# Patient Record
Sex: Female | Born: 1935 | Race: White | Hispanic: No | Smoking: Current every day smoker
Health system: Southern US, Community
[De-identification: ages and names within clinical notes are randomized; demographics above are authoritative.]

## PROBLEM LIST (undated history)

## (undated) DIAGNOSIS — N183 Chronic kidney disease, stage 3 unspecified: Secondary | ICD-10-CM

## (undated) DIAGNOSIS — D472 Monoclonal gammopathy: Secondary | ICD-10-CM

## (undated) DIAGNOSIS — E785 Hyperlipidemia, unspecified: Secondary | ICD-10-CM

## (undated) DIAGNOSIS — H3530 Unspecified macular degeneration: Secondary | ICD-10-CM

## (undated) DIAGNOSIS — I6521 Occlusion and stenosis of right carotid artery: Secondary | ICD-10-CM

## (undated) DIAGNOSIS — Z8554 Personal history of malignant neoplasm of ureter: Secondary | ICD-10-CM

## (undated) DIAGNOSIS — Z86711 Personal history of pulmonary embolism: Secondary | ICD-10-CM

## (undated) DIAGNOSIS — K219 Gastro-esophageal reflux disease without esophagitis: Secondary | ICD-10-CM

## (undated) DIAGNOSIS — K5730 Diverticulosis of large intestine without perforation or abscess without bleeding: Secondary | ICD-10-CM

## (undated) DIAGNOSIS — D494 Neoplasm of unspecified behavior of bladder: Secondary | ICD-10-CM

## (undated) DIAGNOSIS — Z9109 Other allergy status, other than to drugs and biological substances: Secondary | ICD-10-CM

## (undated) DIAGNOSIS — I10 Essential (primary) hypertension: Secondary | ICD-10-CM

## (undated) DIAGNOSIS — I350 Nonrheumatic aortic (valve) stenosis: Secondary | ICD-10-CM

## (undated) DIAGNOSIS — J449 Chronic obstructive pulmonary disease, unspecified: Secondary | ICD-10-CM

## (undated) HISTORY — DX: Unspecified macular degeneration: H35.30

## (undated) HISTORY — DX: Essential (primary) hypertension: I10

## (undated) HISTORY — PX: CARDIOVASCULAR STRESS TEST: SHX262

## (undated) HISTORY — DX: Chronic obstructive pulmonary disease, unspecified: J44.9

## (undated) HISTORY — PX: OTHER SURGICAL HISTORY: SHX169

---
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
## 1977-01-24 HISTORY — PX: ABDOMINAL HYSTERECTOMY: SHX81

## 1997-05-14 ENCOUNTER — Other Ambulatory Visit: Admission: RE | Admit: 1997-05-14 | Discharge: 1997-05-14 | Payer: Self-pay | Admitting: Obstetrics and Gynecology

## 1998-06-24 ENCOUNTER — Other Ambulatory Visit: Admission: RE | Admit: 1998-06-24 | Discharge: 1998-06-24 | Payer: Self-pay | Admitting: Obstetrics and Gynecology

## 1999-05-10 ENCOUNTER — Encounter: Payer: Self-pay | Admitting: Family Medicine

## 1999-05-10 ENCOUNTER — Encounter: Admission: RE | Admit: 1999-05-10 | Discharge: 1999-05-10 | Payer: Self-pay | Admitting: Family Medicine

## 1999-06-04 ENCOUNTER — Other Ambulatory Visit: Admission: RE | Admit: 1999-06-04 | Discharge: 1999-06-04 | Payer: Self-pay | Admitting: Obstetrics and Gynecology

## 1999-10-29 ENCOUNTER — Encounter: Payer: Self-pay | Admitting: Emergency Medicine

## 1999-10-29 ENCOUNTER — Emergency Department (HOSPITAL_COMMUNITY): Admission: EM | Admit: 1999-10-29 | Discharge: 1999-10-29 | Payer: Self-pay | Admitting: Emergency Medicine

## 2000-02-07 ENCOUNTER — Encounter: Payer: Self-pay | Admitting: Obstetrics and Gynecology

## 2000-02-07 ENCOUNTER — Encounter: Admission: RE | Admit: 2000-02-07 | Discharge: 2000-02-07 | Payer: Self-pay | Admitting: Obstetrics and Gynecology

## 2000-05-19 ENCOUNTER — Encounter: Payer: Self-pay | Admitting: Obstetrics and Gynecology

## 2000-05-19 ENCOUNTER — Encounter: Admission: RE | Admit: 2000-05-19 | Discharge: 2000-05-19 | Payer: Self-pay | Admitting: Obstetrics and Gynecology

## 2000-06-09 ENCOUNTER — Other Ambulatory Visit: Admission: RE | Admit: 2000-06-09 | Discharge: 2000-06-09 | Payer: Self-pay | Admitting: Obstetrics and Gynecology

## 2001-06-06 ENCOUNTER — Encounter: Admission: RE | Admit: 2001-06-06 | Discharge: 2001-06-06 | Payer: Self-pay | Admitting: Obstetrics and Gynecology

## 2001-06-06 ENCOUNTER — Encounter: Payer: Self-pay | Admitting: Obstetrics and Gynecology

## 2002-07-01 ENCOUNTER — Encounter: Admission: RE | Admit: 2002-07-01 | Discharge: 2002-07-01 | Payer: Self-pay | Admitting: Obstetrics and Gynecology

## 2002-07-01 ENCOUNTER — Encounter: Payer: Self-pay | Admitting: Obstetrics and Gynecology

## 2002-07-06 ENCOUNTER — Encounter: Payer: Self-pay | Admitting: Emergency Medicine

## 2002-07-06 ENCOUNTER — Emergency Department (HOSPITAL_COMMUNITY): Admission: EM | Admit: 2002-07-06 | Discharge: 2002-07-06 | Payer: Self-pay | Admitting: Emergency Medicine

## 2003-03-03 ENCOUNTER — Emergency Department (HOSPITAL_COMMUNITY): Admission: EM | Admit: 2003-03-03 | Discharge: 2003-03-03 | Payer: Self-pay | Admitting: Emergency Medicine

## 2003-03-19 ENCOUNTER — Encounter (HOSPITAL_BASED_OUTPATIENT_CLINIC_OR_DEPARTMENT_OTHER): Admission: RE | Admit: 2003-03-19 | Discharge: 2003-03-31 | Payer: Self-pay | Admitting: Internal Medicine

## 2003-08-13 ENCOUNTER — Encounter: Admission: RE | Admit: 2003-08-13 | Discharge: 2003-08-13 | Payer: Self-pay | Admitting: Obstetrics and Gynecology

## 2004-11-21 ENCOUNTER — Observation Stay (HOSPITAL_COMMUNITY): Admission: EM | Admit: 2004-11-21 | Discharge: 2004-11-22 | Payer: Self-pay | Admitting: *Deleted

## 2005-05-13 ENCOUNTER — Encounter: Admission: RE | Admit: 2005-05-13 | Discharge: 2005-05-13 | Payer: Self-pay | Admitting: Obstetrics and Gynecology

## 2005-06-13 ENCOUNTER — Encounter: Admission: RE | Admit: 2005-06-13 | Discharge: 2005-06-28 | Payer: Self-pay | Admitting: Family Medicine

## 2005-09-22 ENCOUNTER — Ambulatory Visit: Payer: Self-pay | Admitting: Internal Medicine

## 2005-10-19 ENCOUNTER — Ambulatory Visit: Payer: Self-pay | Admitting: Internal Medicine

## 2005-10-27 ENCOUNTER — Ambulatory Visit: Payer: Self-pay | Admitting: Internal Medicine

## 2006-11-22 ENCOUNTER — Encounter: Admission: RE | Admit: 2006-11-22 | Discharge: 2006-11-22 | Payer: Self-pay | Admitting: Otolaryngology

## 2007-03-21 ENCOUNTER — Encounter: Admission: RE | Admit: 2007-03-21 | Discharge: 2007-03-21 | Payer: Self-pay | Admitting: Family Medicine

## 2009-09-08 ENCOUNTER — Encounter: Admission: RE | Admit: 2009-09-08 | Discharge: 2009-09-08 | Payer: Self-pay | Admitting: Geriatric Medicine

## 2010-05-27 ENCOUNTER — Other Ambulatory Visit: Payer: Self-pay | Admitting: Nephrology

## 2010-05-27 ENCOUNTER — Ambulatory Visit
Admission: RE | Admit: 2010-05-27 | Discharge: 2010-05-27 | Disposition: A | Discharge status: Home / Self Care | Payer: Medicare Other | Source: Ambulatory Visit | Attending: Nephrology | Admitting: Nephrology

## 2010-05-27 DIAGNOSIS — D472 Monoclonal gammopathy: Secondary | ICD-10-CM

## 2010-06-02 ENCOUNTER — Other Ambulatory Visit (HOSPITAL_COMMUNITY): Payer: Self-pay | Admitting: Urology

## 2010-06-02 ENCOUNTER — Other Ambulatory Visit: Payer: Self-pay | Admitting: Oncology

## 2010-06-02 ENCOUNTER — Encounter (HOSPITAL_BASED_OUTPATIENT_CLINIC_OR_DEPARTMENT_OTHER): Payer: Medicare Other | Admitting: Oncology

## 2010-06-02 DIAGNOSIS — N1330 Unspecified hydronephrosis: Secondary | ICD-10-CM

## 2010-06-02 DIAGNOSIS — D472 Monoclonal gammopathy: Secondary | ICD-10-CM

## 2010-06-02 DIAGNOSIS — N289 Disorder of kidney and ureter, unspecified: Secondary | ICD-10-CM

## 2010-06-02 LAB — CBC WITH DIFFERENTIAL/PLATELET
BASO%: 0.4 % (ref 0.0–2.0)
HCT: 33.7 % — ABNORMAL LOW (ref 34.8–46.6)
HGB: 11.3 g/dL — ABNORMAL LOW (ref 11.6–15.9)
MONO#: 0.5 10*3/uL (ref 0.1–0.9)
MONO%: 7.2 % (ref 0.0–14.0)
NEUT#: 5.2 10*3/uL (ref 1.5–6.5)
RDW: 13.7 % (ref 11.2–14.5)
WBC: 7.6 10*3/uL (ref 3.9–10.3)
lymph#: 1.7 10*3/uL (ref 0.9–3.3)

## 2010-06-04 ENCOUNTER — Other Ambulatory Visit (HOSPITAL_COMMUNITY): Payer: Medicare Other

## 2010-06-07 ENCOUNTER — Encounter (HOSPITAL_COMMUNITY)
Admission: RE | Admit: 2010-06-07 | Discharge: 2010-06-07 | Disposition: A | Discharge status: Home / Self Care | Payer: Medicare Other | Source: Ambulatory Visit | Attending: Urology | Admitting: Urology

## 2010-06-07 DIAGNOSIS — N1330 Unspecified hydronephrosis: Secondary | ICD-10-CM

## 2010-06-07 DIAGNOSIS — N289 Disorder of kidney and ureter, unspecified: Secondary | ICD-10-CM

## 2010-06-07 LAB — COMPREHENSIVE METABOLIC PANEL
ALT: 33 U/L (ref 0–35)
AST: 42 U/L — ABNORMAL HIGH (ref 0–37)
Alkaline Phosphatase: 55 U/L (ref 39–117)
BUN: 44 mg/dL — ABNORMAL HIGH (ref 6–23)
Calcium: 9.9 mg/dL (ref 8.4–10.5)
Creatinine, Ser: 1.73 mg/dL — ABNORMAL HIGH (ref 0.40–1.20)
Total Protein: 6.7 g/dL (ref 6.0–8.3)

## 2010-06-07 LAB — SPEP & IFE WITH QIG
Albumin ELP: 59.9 % (ref 55.8–66.1)
Gamma Globulin: 13.3 % (ref 11.1–18.8)
Total Protein, Serum Electrophoresis: 6.7 g/dL (ref 6.0–8.3)

## 2010-06-07 LAB — KAPPA/LAMBDA LIGHT CHAINS
Kappa free light chain: 1.49 mg/dL (ref 0.33–1.94)
Lambda Free Lght Chn: 2.13 mg/dL (ref 0.57–2.63)

## 2010-06-07 MED ORDER — TECHNETIUM TC 99M MERTIATIDE
15.2000 | Freq: Once | INTRAVENOUS | Status: AC | PRN
  Administered 2010-06-07: 15.2 via INTRAVENOUS

## 2010-06-07 MED ORDER — FUROSEMIDE 10 MG/ML IJ SOLN: 23.0000 mg/h | INTRAVENOUS | Status: DC

## 2010-06-11 NOTE — Consult Note (Signed)
NAMEJESSIECA, [name]                        ACCOUNT NO.:  [id number]   MEDICAL RECORD NO.:  [id number]                   PATIENT TYPE:  REC   LOCATION:  FOOT                                 FACILITY:  [facility]   PHYSICIAN:  Jonelle Sports. Sevier, M.D.              DATE OF BIRTH:  1924-10-17   DATE OF CONSULTATION:  03/20/2003  DATE OF DISCHARGE:                                   CONSULTATION   HISTORY:  This 77 year old white female is seen at the courtesy of Dr.  Cyndia Bent for evaluation of chronic traumatic lesion on the right  pretibial area.   The patient apparently has enjoyed good health and has had no significant  lower extremity problems despite the fact that she is a smoker of one-half  pack of cigarettes per day for greater than 50 years.   In mid January she sustained a traumatic skin avulsion on the right anterior  pretibial area.  This became secondarily reddened, and she was seen by her  primary doctor and then later by Dr. Jamey Ripa.  She was given a course of Z-  Pak initially and then later a 10-day course of Tequin.  Dr. Cyndia Bent debrided the lesion on at least 1 occasion and treated it then with  wet-to-dry dressings.  She was referred here for ongoing care.   The patient reports that since she attempted to schedule this appointment  approximately 3 or 4 weeks ago that there has been progressive improvement  in the area, which is now covered by a dark eschar that has gotten  progressively smaller.   She is here today for our evaluation and advice.   PAST MEDICAL HISTORY:  Positive for asthma and hypertension.   ALLERGIES:  She is allergic to PENICILLIN.   MEDICATIONS:  Regular medications include Advair and Singulair as well as  lisinopril.   PHYSICAL EXAMINATION:  Examination today is limited to the distal lower  extremities.  The feet are without edema or deformity.  There is evidence of  superficial varicosities but no evidence of  chronic  venous insufficiency.  Skin temperatures are normal and symmetrical.  There are minor calluses at  several areas on the feet and also a minor corn on the dorsal aspect of the  left fifth toe.  Pulses are everywhere palpable and quite adequate.  Monofilament testing shows she has preservation of protective sensation  throughout both feet.   DISPOSITION:  1. The wound is carefully examined and shows no evidence of secondary     infection or inflammation.  In that it appears to be gradually healing     beneath this eschar and in that they report it has gotten progressively     smaller, it is recommended to the patient that we not remove the eschar     but rather allow this healing to continue with the anticipation that it     may  take several months for complete healing.  She is advised in the     interim to keep it clean,     to apply Neosporin on a daily basis, and to cover it with a nonstick pad     for some added protection.  2. She is given an open-door policy here so should there be any worsening     she might feel free to come back without hesitation.                                               Jonelle Sports. Cheryll Cockayne, M.D.    RES/MEDQ  D:  03/20/2003  T:  03/20/2003  Job:  [id number]   cc:   Currie Paris, M.D.  [address]  [place]  [place] [address]  Fax: [phone]

## 2010-06-11 NOTE — Assessment & Plan Note (Signed)
[facility]                               PULMONARY OFFICE NOTE   NAME:Umbarger, NYILAH KIGHT                     MRN:          [id number]  DATE:09/22/2005                            DOB:          09/20/1924    DATE OF CONSULTATION:  September 22, 2005.   REASON FOR CONSULTATION:  Dyspnea.   HISTORY:  This is a 77 year old, white female with variable dyspnea dating  back several years; still actively smoking three to four cigarettes per day  but has noted marked increase in dyspnea over the last three weeks since her  daughter moved back in with her three weeks ago and brought a dog with her.  She denies any itching, sneezing but does have subjective wheeze, cough and  congestion that is worse when she is at home.  She denies any exertional  chest pain, purulent sputum, active sinus or reflux symptoms.  The patient  has been taking prednisone presently with no apparent benefit in terms of  her dyspnea and only marginal improvement after Albuterol HFA (she strongly  prefers Albuterol but is having getting it).   PAST MEDICAL HISTORY:  Past medical history is significant for:  1. Hypertension for which she is on ACE inhibitors.  2. Hyperlipidemia.   ALLERGIES:  Penicillin and sulfa medications.   MEDICATIONS:  1. Lisinopril;  2. Vytorin;  3. Toprol;  4. Singulair;  5. Aspirin;  6. Advair;  7. Calcium;  8. Multivitamins;  9. Prednisone;  10.Flexeril;  11.ProAir.   For full interval dosing, please see note of September 22, 2005.   SOCIAL HISTORY:  She continues to smoke three to four cigarettes per day.  She is retired.  No unusual travel or hobby exposure.  She does have a new  pet in the house as noted over the three weeks that she has been worse,  although note she really complains of dyspnea dating back several years.   FAMILY HISTORY:  Significant for the absence of respiratory distress or  atypia.   REVIEW OF SYSTEMS:  Taken in detail,  significant for the problems outlined  above.   PHYSICAL EXAMINATION:  GENERAL:  This is a hoarse, white female in no acute  distress.  She has classic voice fatigue and the hoarseness worsened the  more she talked.  VITAL SIGNS:  She is afebrile.  Blood pressure is 140/94.  HEENT:  Unremarkable.  Pharynx clear.  No evidence of excessive postnasal  drainage or cobblestoning.  NECK:  Neck is supple without cervical adenopathy or tenderness.  The  trachea is in the midline.  LUNGS:  The lung fields reveal more pseudo-wheeze than true wheeze  bilaterally.  HEART:  Regular rate and rhythm without murmurs, gallops or rubs.  ABDOMEN:  Soft, benign.  EXTREMITIES:  Warm without calf tenderness, clubbing, cyanosis or edema.   LABORATORY DATA:  Chest x-ray was not available done at [facility] in November of  2006.   IMPRESSION:  Refractory asthma with mostly upper airway features at  present.  May either be an adverse effect of Advair, occult reflux, or  adverse effect from Lisinopril.   RECOMMENDATIONS:  I therefore recommend the following:  Stop Advair and  Lisinopril and replace these with Symbicort 160/4.5, two puffs twice daily  with p.r.n. use of Albuterol (fine with me if she uses [facility] product but the  point is still to minimize the need for this).  Replace Lisinopril with  Benicar 40/12.5 mg on an empiric, short-trial basis only with follow up here  with pulmonary function tests and a chest x-ray within the next six weeks  (she has enough samples to last six weeks at present).                                   Charlaine Dalton. Sherene Sires, MD, [facility]   MBW/MedQ  DD:  09/22/2005  DT:  09/23/2005  Job #:  [id number]   cc:   Caryn Bee L. Little, MD

## 2010-06-11 NOTE — H&P (Signed)
[name], [name] NO.:  [id number]   MEDICAL RECORD NO.:  [id number]          PATIENT TYPE:  EMS   LOCATION:  MAJO                         FACILITY:  [facility]   PHYSICIAN:  Hollice Espy, M.D.DATE OF BIRTH:  Jan 28, 1924   DATE OF ADMISSION:  11/21/2004  DATE OF DISCHARGE:                                HISTORY & PHYSICAL   PRIMARY CARE PHYSICIAN:  Chales Salmon. Abigail Miyamoto, M.D.   COVERING PHYSICIAN FOR DR Abigail Miyamoto:  Dr. Tenny Craw.   CHIEF COMPLAINT:  Swollen tongue.   The patient is a 77 year old white female with a past medical history of  hypertension, hyperlipidemia, who is scheduled to follow to have a  colonoscopy. She gives a very slightly vague history, but apparently an  infection was noted and Dr. Tenny Craw, who is covering for Dr. Abigail Miyamoto, called in  the patient Bactrim, an antibiotic, one tablet p.o. b.i.d., and plans for a  colonoscopy. The patient, on Saturday, took one dose in the morning and one  dose in the evening. She woke up in the middle of the night feeling slightly  short of breath. She went into the bathroom and noted that her tongue was  quite enlarged. She had trouble talking and she became concerned. Paramedics  were called and she was brought into the emergency room. In the emergency  room she was given one dose of Solu-Medrol 125 mg, one dose of IV Benadryl  at 25 mg as well as Pepcid and an albuterol inhaler. The patient began to  feel markedly better and currently feels quite sleepy. Three hours later the  patient continues to feel better. However, her tongue is still swollen and  she still has difficulty with her speech. She, otherwise, appears to be in  no apparent distress. She denies any other complaints such as headaches,  chest pain, dysphagia, palpitations, wheezing, coughing, shortness of  breath, abdominal pain, hematuria, dysuria, constipation, diarrhea, focal  extremity numbness, weakness, or pain.   REVIEW OF SYSTEMS:  Otherwise  negative.   After discussion with the ER attending, plan will be to put the patient in a  12 to 24 hour observation and once her symptoms have completely resolved she  may be able to go home if there are no further incidents. The patient is  amenable to this.   PAST MEDICAL HISTORY:  1.  Hyperlipidemia.  2.  Tobacco abuse.  3.  Hypertension.  4.  Asthma.   MEDICATIONS:  1.  Lisinopril 40 mg daily.  2.  Vytorin 10/20 p.o. daily.  3.  Toprol XL 25 mg daily.  4.  Singulair 10 p.o. daily.  5.  Aspirin 81 mg output daily.  6.  Os-Cal D 1200 p.o. daily.  7.  MVI p.o. daily.  8.  Advair Diskus 250/50 one puff b.i.d.  9.  Albuterol inhaler p.r.n.   ALLERGIES:  She has a previous allergy to PENICILLIN and we will add SULFA  to this allergy.   SOCIAL HISTORY:  She denies any drug or alcohol use or abuse. She smokes  about a half pack a day.   FAMILY  HISTORY:  Noncontributory.   PHYSICAL EXAMINATION:  VITAL SIGNS: On admission, temperature 95.6, heart  rate 81, blood pressure 134/57, O2 saturation 98% on 10 liters. Now she is  saturating 96% on room air. Her blood pressure has come down to 109/55.  GENERAL: The patient is alert and oriented times three and in no apparent  distress.  HEENT: Normocephalic and atraumatic. Her mucous membranes are slightly dry.  She is noted to have an enlarged tongue. Her speech is affected by this. No  carotid bruits.  HEART: Regular rate and rhythm. S1 and S2.  LUNGS: Clear to auscultation bilaterally.  ABDOMEN: Soft, nontender, nondistended. Positive bowel sounds.  EXTREMITIES: No clubbing, cyanosis, or edema.   LABWORK:  White count 8.9, H&H 12.8/37.8, MCV 91, platelet count 279,000. UA  shows trace leukocyte esterase, small hemoglobin. Sodium 137, potassium 4.3,  chloride 108, bicarbonate 27, BUN 27, creatinine 2. UA shows 7-10 red cells,  white cells 0-2, glucose 103.   ASSESSMENT/PLAN:  1.  Renal insufficiency. Will put the patient on  normal saline at 100 mL an      hour. Likely some complaint of this is dehydration and this should      improve with IV fluids. She also knew she may have some underlying renal      insufficiency secondary to hypertension.  2.  Medication allergy. Will observe the patient holding her medication and      have p.r.n. Benadryl and albuterol if necessary. The patient is feeling      better, tolerating p.o., and her symptoms have resolved. Will be able to      discharge her later on today and if not tomorrow morning, and she can      follow up with her PCP.      Hollice Espy, M.D.  Electronically Signed     SKK/MEDQ  D:  11/21/2004  T:  11/21/2004  Job:  [id number]   cc:   Chales Salmon. Abigail Miyamoto, M.D.  Fax: [phone]   Dr. Tenny Craw

## 2010-06-11 NOTE — Assessment & Plan Note (Signed)
[facility]                               PULMONARY OFFICE NOTE   NAME:Wisener, SHANTAL ROAN                     MRN:          [id number]  DATE:10/27/2005                            DOB:          [birth year]    HISTORY:  This is a 77 year old white female still smoking a few  cigarettes complaining of dyspnea only with heavy exertion, but no  orthopnea, PND, leg swelling, fever, chills, or sweats.  She comes in as  requested for PFTs, having been taken off Lisinopril in favor of Benicar 1/2  daily for a possible upper airway cough syndrome/pseudo-wheeze, which has  resolved completely.   PHYSICAL EXAMINATION:  She is a pleasant ambulatory white female with  minimal hoarseness.  She is afebrile with stable vital signs.  HEENT:  Unremarkable.  Oropharynx is clear.  LUNGS:  Fields reveal diminished breath sounds with no wheezing.  HEART:  Regular rate and rhythm without murmur, gallop or rub.  ABDOMEN:  Soft, benign.  EXTREMITIES:  Warm without calf tenderness, cyanosis, clubbing, or edema.   Hemoglobin saturation 98% on room air.  PFTs reveal an FEV1 of 85% predicted  with a ratio of 45%, a 23% improvement after bronchodilators.   IMPRESSION:  1. Chronic obstructive pulmonary disease with a definite asthmatic      component which is only partially reversed by Symbicort likely because      the patient is actively smoking.  I have recommended that she continue      Symbicort.  2. Cough has completely been eliminated off of ACE inhibitors while being      treated for acid reflux.  I was hoping the cough was due to ACE      inhibitors, but the last notes indicate she did not improve until      Zegerid was added.  I have asked her to continue these but keep in mind      that smoking may be contributing to both lower and upper airway      instability and if she gets rid of the cigarettes we may be able to      get rid of some of her maintenance  medicines.   In the absence of a complete commitment on her part to stop smoking, I am  going to recommend that she stay on the Zegerid 40 mg at bedtime and  continue, also, Symbicort 160/4.5, 2 puffs b.i.d.  If she is not doing as  well as she wants on this regimen, I would be happy to see her back but will  continue to point out to her that smoking sensation, not choice of inhalers  or physicians, is really the key to long term lung health.  If she does  great on this combination and is able to stop smoking, I would consider  starting the Zegerid first and tapering off the Symbicort second.   I reviewed these issues with her in detail and also reviewed the  significance of her PFTs in the context of long term lung health.  We will  see her back here  on a p.r.n. basis only.  P.r.n. refills or she can see Dr.  Catha Gosselin for refills.            ______________________________  Charlaine Dalton Sherene Sires, MD, [facility]      MBW/MedQ  DD:  10/27/2005  DT:  10/28/2005  Job #:  [id number]   cc:   Caryn Bee L. Little, M.D.

## 2010-06-11 NOTE — Assessment & Plan Note (Signed)
[facility]                               PULMONARY OFFICE NOTE   NAME:[name], [name]                     MRN:          [id number]  DATE:10/19/2005                            DOB:          [birth year]    HISTORY:  A 77 year old white female who just stopped smoking several days  ago with complaints of sore throat for the last 10 days and feeling that her  throat is closing up.  I was concerned about pseudoasthma when I saw her  on August 30 and asked her to stop lisinopril, which she assures me she has  done and replaced it with Benicar/HCTZ 40/12.5 1 daily.  She denies any  overt reflux or sinus symptoms, purulent sputum or difficulty swallowing.   PHYSICAL EXAMINATION:  GENERAL:  She is a robust, pleasant elderly white  female who is mildly hoarse but otherwise in no acute distress.  VITAL SIGNS:  She has stable vital signs.  HEENT:  Remarkable for the absence of erythema or cervical tenderness.  LUNGS:  Lung fields reveal diminished breath sounds but no wheezing.  HEART:  Regular rate and rhythm without murmur, rub or gallop.  ABDOMEN:  Soft, benign.  EXTREMITIES:  Normal without calf tenderness, clubbing, cyanosis or edema.   IMPRESSION:  1. No evidence of pharyngitis.  Most likely what she is experiencing is      reflux for which lisinopril was simply fueling the fire, rather than      the cause of the fire.  I am going to add Zegerid, therefore, 40 mg at      bedtime until she returns for PFTs, as previously requested.  2. Singulair probably is not needed.  At this point, I am going to ask her      to stop it and see if any of her upper airway symptoms flare while      being treated for reflux, reasoning that there probably are not two      primary processes going on at the same time, rhinitis and reflux, and      that the rhinitis she may have experienced very well may have been      coming from reflux from the start.  3. Finally, note  that her blood pressure is relatively low on Benicar      40/HCT, which replaced the lisinopril; therefore, I am going to reduce      the dose to one-half daily.  4. If she continues to have upper airway symptoms, I believe an ears,      nose, and throat evaluation would be appropriate.  Will defer this to      Dr. Fredirick Maudlin judgment.            ______________________________  Charlaine Dalton. Sherene Sires, MD, [facility]    MBW/MedQ  DD:  10/19/2005  DT:  10/21/2005  Job #:  [id number]   cc:   Caryn Bee L. Little, M.D.

## 2010-06-15 ENCOUNTER — Other Ambulatory Visit: Payer: Self-pay | Admitting: Urology

## 2010-06-15 ENCOUNTER — Ambulatory Visit (HOSPITAL_COMMUNITY)
Admission: RE | Admit: 2010-06-15 | Discharge: 2010-06-15 | Disposition: A | Discharge status: Home / Self Care | Payer: Medicare Other | Source: Ambulatory Visit | Attending: Urology | Admitting: Urology

## 2010-06-15 ENCOUNTER — Ambulatory Visit (HOSPITAL_COMMUNITY): Payer: Medicare Other

## 2010-06-15 ENCOUNTER — Ambulatory Visit (HOSPITAL_BASED_OUTPATIENT_CLINIC_OR_DEPARTMENT_OTHER)
Admission: RE | Admit: 2010-06-15 | Discharge: 2010-06-15 | Disposition: A | Discharge status: Home / Self Care | Payer: Medicare Other | Source: Ambulatory Visit | Attending: Urology | Admitting: Urology

## 2010-06-15 DIAGNOSIS — Z01818 Encounter for other preprocedural examination: Secondary | ICD-10-CM | POA: Insufficient documentation

## 2010-06-15 DIAGNOSIS — I10 Essential (primary) hypertension: Secondary | ICD-10-CM | POA: Insufficient documentation

## 2010-06-15 DIAGNOSIS — N1330 Unspecified hydronephrosis: Secondary | ICD-10-CM | POA: Insufficient documentation

## 2010-06-15 DIAGNOSIS — Z01812 Encounter for preprocedural laboratory examination: Secondary | ICD-10-CM | POA: Insufficient documentation

## 2010-06-15 DIAGNOSIS — Z79899 Other long term (current) drug therapy: Secondary | ICD-10-CM | POA: Insufficient documentation

## 2010-06-15 DIAGNOSIS — D4959 Neoplasm of unspecified behavior of other genitourinary organ: Secondary | ICD-10-CM | POA: Insufficient documentation

## 2010-06-15 DIAGNOSIS — R9431 Abnormal electrocardiogram [ECG] [EKG]: Secondary | ICD-10-CM | POA: Insufficient documentation

## 2010-06-15 HISTORY — PX: OTHER SURGICAL HISTORY: SHX169

## 2010-06-15 LAB — POCT I-STAT 4, (NA,K, GLUC, HGB,HCT)
HCT: 39 % (ref 36.0–46.0)
Hemoglobin: 13.3 g/dL (ref 12.0–15.0)
Potassium: 4.6 mEq/L (ref 3.5–5.1)
Sodium: 143 mEq/L (ref 135–145)

## 2010-07-06 ENCOUNTER — Other Ambulatory Visit (HOSPITAL_COMMUNITY): Payer: Medicare Other

## 2010-07-09 ENCOUNTER — Inpatient Hospital Stay (HOSPITAL_COMMUNITY): Admission: RE | Admit: 2010-07-09 | Payer: Medicare Other | Source: Ambulatory Visit | Admitting: Urology

## 2010-07-17 NOTE — Op Note (Signed)
[name] NO.:  [id number]  MEDICAL RECORD NO.:  [id number]           PATIENT TYPE:  O  LOCATION:  XRAY                         FACILITY:  [facility]  PHYSICIAN:  Danae Chen, M.D.  DATE OF BIRTH:  [birth year]  DATE OF PROCEDURE:  06/15/2010 DATE OF DISCHARGE:                              OPERATIVE REPORT   PREOPERATIVE DIAGNOSIS:  Severe left hydroureteronephrosis.  POSTOPERATIVE DIAGNOSIS:  Severe left hydroureteronephrosis, plus left distal ureteral tumor.  PROCEDURE:  Cystoscopy, left retrograde pyelogram, ureteroscopy, biopsy of left ureteral tumor and insertion of double-J stent.  SURGEON:  Danae Chen, M.D.  ANESTHESIA:  General.  INDICATIONS:  The patient is an 77 year old female who was found on ultrasound to have severe left hydronephrosis.  CT scan revealed severe left hydroureteronephrosis down to the left UVJ.  Renal scan showed 56% function on the right side and 44% function on the left.  She does not have any history of hematuria.  She is scheduled today for cystoscopy and left retrograde pyelogram and insertion of double-J stent.  DESCRIPTION OF PROCEDURE:  The patient was identified by her wristband and proper time-out was taken.  Under general anesthesia, she was prepped and draped and placed in the dorsal lithotomy position.  A panendoscope was inserted in the bladder. The bladder mucosa is normal.  There is no stone or tumor in the bladder.  The ureteral orifices are in normal position and shape.  Retrograde pyelogram.  A cone-tip catheter was passed through the cystoscope and through the left ureteral orifice.  Contrast was then injected through the cone-tip catheter.  There is a filling defect in the distal ureter at and above the UVJ.  The ureter proximal to the filling defect is markedly dilated.  The cone-tip catheter was removed.  A sensor wire could not be passed through the ureter.  The sensor wire was removed.  A  Glidewire was then passed through an open-ended catheter and both open-ended catheter and Glidewire were passed through the cystoscope and through the ureteral orifice and the Glidewire was advanced without difficulty into the proximal ureter.  The open-ended catheter was then advanced over the guidewire.  There was difficulty getting the open-ended catheter into the renal pelvis.  The Glidewire was removed and contrast was then injected through the open-ended catheter.  The proximal ureter is tortuous and making it difficult to advance the open-ended catheter into the renal pelvis.  The renal pelvis is markedly dilated.  The Glidewire was then again passed through the open-ended catheter and after several attempts I was able to pass the Glidewire into the renal pelvis and the open-ended catheter was then advanced over the Glidewire into the renal pelvis.  The Glidewire was removed.  Contrast was then injected through the open-ended catheter and it showed marked dilation of the renal pelvis and collecting system and calyces.  The sensor wire was then passed over the open-ended catheter and the open-ended catheter was removed.  The bladder was emptied and the cystoscope removed.  A semirigid ureteroscope was then passed in the bladder and through the left ureteral orifice.  There  was a tumor in the distal ureter slightly above the UVJ.  The ureter proximal to the ureteral tumor is markedly dilated.  A brush biopsy forceps was then passed through the ureteroscope and a brush biopsy of the ureteral tumor was done.  The specimen was then placed in saline and sent to pathology.  Then cold cup biopsy forceps was passed through the ureteroscope and several biopsies of the ureteral tumor were done.  The specimen was also sent to pathology.  The ureter proximal to the distal ureteral tumor is markedly dilated without any evidence of filling defect in the ureter.  The ureteroscope was removed.  The  guidewire was then back loaded into the cystoscope and a #8-French - 28 double-J stent was passed over the guidewire.  The proximal curl of the double-J stent is in the renal pelvis.  The guidewire was removed and the distal curl of the double-J stent is in the bladder.  The bladder was then emptied and the cystoscope and guidewire were removed.  The patient tolerated the procedure well and left the OR in satisfactory condition to postanesthesia care unit.     Danae Chen, M.D.     MN/MEDQ  D:  06/15/2010  T:  06/15/2010  Job:  [id number]  cc:   Caryn Bee L. Little, M.D. Fax: [phone]  Electronically Signed by Lindaann Slough M.D. on 07/17/2010 01:18:51 PM

## 2010-08-05 ENCOUNTER — Other Ambulatory Visit: Payer: Self-pay | Admitting: Urology

## 2010-08-05 ENCOUNTER — Encounter (HOSPITAL_COMMUNITY): Payer: Medicare Other

## 2010-08-05 LAB — CBC
HCT: 38.2 % (ref 36.0–46.0)
Hemoglobin: 12.5 g/dL (ref 12.0–15.0)
MCH: 30.9 pg (ref 26.0–34.0)
MCHC: 32.7 g/dL (ref 30.0–36.0)
MCV: 94.6 fL (ref 78.0–100.0)
WBC: 8.5 10*3/uL (ref 4.0–10.5)

## 2010-08-05 LAB — URINALYSIS, ROUTINE W REFLEX MICROSCOPIC
Ketones, ur: NEGATIVE mg/dL
Nitrite: NEGATIVE
Specific Gravity, Urine: 1.013 (ref 1.005–1.030)

## 2010-08-05 LAB — SURGICAL PCR SCREEN: Staphylococcus aureus: POSITIVE — AB

## 2010-08-05 LAB — COMPREHENSIVE METABOLIC PANEL
ALT: 52 U/L — ABNORMAL HIGH (ref 0–35)
Albumin: 3 g/dL — ABNORMAL LOW (ref 3.5–5.2)
BUN: 20 mg/dL (ref 6–23)
Chloride: 99 mEq/L (ref 96–112)
Creatinine, Ser: 1.27 mg/dL — ABNORMAL HIGH (ref 0.50–1.10)
GFR calc non Af Amer: 40 mL/min — ABNORMAL LOW (ref >60)
Potassium: 4.8 mEq/L (ref 3.5–5.1)
Sodium: 138 mEq/L (ref 135–145)

## 2010-08-05 LAB — URINE MICROSCOPIC-ADD ON

## 2010-08-06 LAB — URINE CULTURE
Colony Count: NO GROWTH
Culture  Setup Time: 201207130139
Special Requests: NEGATIVE

## 2010-08-13 ENCOUNTER — Inpatient Hospital Stay (HOSPITAL_COMMUNITY)
Admission: RE | Admit: 2010-08-13 | Discharge: 2010-08-18 | DRG: 658 | Disposition: A | Discharge status: Home Health | Payer: Medicare Other | Source: Ambulatory Visit | Attending: Urology | Admitting: Urology

## 2010-08-13 ENCOUNTER — Other Ambulatory Visit: Payer: Self-pay | Admitting: Urology

## 2010-08-13 DIAGNOSIS — J449 Chronic obstructive pulmonary disease, unspecified: Secondary | ICD-10-CM | POA: Diagnosis present

## 2010-08-13 DIAGNOSIS — N3289 Other specified disorders of bladder: Secondary | ICD-10-CM | POA: Diagnosis not present

## 2010-08-13 DIAGNOSIS — C669 Malignant neoplasm of unspecified ureter: Principal | ICD-10-CM | POA: Diagnosis present

## 2010-08-13 DIAGNOSIS — R310 Gross hematuria: Secondary | ICD-10-CM | POA: Diagnosis not present

## 2010-08-13 DIAGNOSIS — Z86711 Personal history of pulmonary embolism: Secondary | ICD-10-CM

## 2010-08-13 DIAGNOSIS — Z01812 Encounter for preprocedural laboratory examination: Secondary | ICD-10-CM

## 2010-08-13 DIAGNOSIS — J4489 Other specified chronic obstructive pulmonary disease: Secondary | ICD-10-CM | POA: Diagnosis present

## 2010-08-13 DIAGNOSIS — I10 Essential (primary) hypertension: Secondary | ICD-10-CM | POA: Diagnosis present

## 2010-08-13 DIAGNOSIS — E7800 Pure hypercholesterolemia, unspecified: Secondary | ICD-10-CM | POA: Diagnosis present

## 2010-08-13 HISTORY — PX: OTHER SURGICAL HISTORY: SHX169

## 2010-08-13 LAB — CBC
MCH: 30.1 pg (ref 26.0–34.0)
MCHC: 32 g/dL (ref 30.0–36.0)
RDW: 12.9 % (ref 11.5–15.5)

## 2010-08-13 LAB — BASIC METABOLIC PANEL
Calcium: 8.8 mg/dL (ref 8.4–10.5)
GFR calc Af Amer: 59 mL/min — ABNORMAL LOW (ref >60)
GFR calc non Af Amer: 49 mL/min — ABNORMAL LOW (ref >60)
Potassium: 4.4 mEq/L (ref 3.5–5.1)
Sodium: 135 mEq/L (ref 135–145)

## 2010-08-13 LAB — ABO/RH: ABO/RH(D): A POS

## 2010-08-14 LAB — CBC
Hemoglobin: 9.6 g/dL — ABNORMAL LOW (ref 12.0–15.0)
MCV: 93.6 fL (ref 78.0–100.0)
Platelets: 246 10*3/uL (ref 150–400)
RBC: 3.14 MIL/uL — ABNORMAL LOW (ref 3.87–5.11)
WBC: 10.2 10*3/uL (ref 4.0–10.5)

## 2010-08-14 LAB — BASIC METABOLIC PANEL
CO2: 27 mEq/L (ref 19–32)
Chloride: 101 mEq/L (ref 96–112)
Creatinine, Ser: 1.23 mg/dL — ABNORMAL HIGH (ref 0.50–1.10)
Glucose, Bld: 170 mg/dL — ABNORMAL HIGH (ref 70–99)
Sodium: 134 mEq/L — ABNORMAL LOW (ref 135–145)

## 2010-08-16 LAB — BASIC METABOLIC PANEL
BUN: 18 mg/dL (ref 6–23)
CO2: 31 mEq/L (ref 19–32)
Chloride: 102 mEq/L (ref 96–112)
Creatinine, Ser: 1.19 mg/dL — ABNORMAL HIGH (ref 0.50–1.10)
Glucose, Bld: 114 mg/dL — ABNORMAL HIGH (ref 70–99)

## 2010-08-16 LAB — CBC
HCT: 30 % — ABNORMAL LOW (ref 36.0–46.0)
MCH: 30 pg (ref 26.0–34.0)
MCV: 95.8 fL (ref 78.0–100.0)
RBC: 3.13 MIL/uL — ABNORMAL LOW (ref 3.87–5.11)
WBC: 9.6 10*3/uL (ref 4.0–10.5)

## 2010-08-19 ENCOUNTER — Emergency Department (HOSPITAL_COMMUNITY): Payer: Medicare Other

## 2010-08-19 ENCOUNTER — Emergency Department (HOSPITAL_COMMUNITY)
Admission: EM | Admit: 2010-08-19 | Discharge: 2010-08-19 | Disposition: A | Discharge status: Home / Self Care | Payer: Medicare Other | Attending: Emergency Medicine | Admitting: Emergency Medicine

## 2010-08-19 DIAGNOSIS — R0602 Shortness of breath: Secondary | ICD-10-CM | POA: Insufficient documentation

## 2010-08-19 DIAGNOSIS — Z8559 Personal history of malignant neoplasm of other urinary tract organ: Secondary | ICD-10-CM | POA: Insufficient documentation

## 2010-08-19 DIAGNOSIS — K209 Esophagitis, unspecified without bleeding: Secondary | ICD-10-CM | POA: Insufficient documentation

## 2010-08-19 DIAGNOSIS — I10 Essential (primary) hypertension: Secondary | ICD-10-CM | POA: Insufficient documentation

## 2010-08-19 DIAGNOSIS — R1310 Dysphagia, unspecified: Secondary | ICD-10-CM | POA: Insufficient documentation

## 2010-08-19 LAB — POCT I-STAT, CHEM 8
BUN: 26 mg/dL — ABNORMAL HIGH (ref 6–23)
Calcium, Ion: 1.09 mmol/L — ABNORMAL LOW (ref 1.12–1.32)
Creatinine, Ser: 1.3 mg/dL — ABNORMAL HIGH (ref 0.50–1.10)
Glucose, Bld: 111 mg/dL — ABNORMAL HIGH (ref 70–99)
TCO2: 27 mmol/L (ref 0–100)

## 2010-08-26 NOTE — H&P (Signed)
  NAMESAMARIAH, HOKENSON NO.:  [id number]  MEDICAL RECORD NO.:  [id number]  LOCATION:  0010                         FACILITY:  [facility]  PHYSICIAN:  Danae Chen, M.D.  DATE OF BIRTH:  1924-05-05  DATE OF ADMISSION:  08/13/2010 DATE OF DISCHARGE:                             HISTORY & PHYSICAL   CHIEF COMPLAINT:  Left distal ureteral tumor.  HISTORY OF PRESENT ILLNESS:  The patient is an 77 year old female who was found during workup for elevated creatinine to have severe left hydroureteronephrosis down to the left UVJ.  Creatinine went up to 1.75 on March 31, 2010 from 1.2 in April 2011.  She had low back pain radiating to the left lower quadrant and left leg.  She also had sensation of incomplete emptying of the bladder.  Cystoscopy and retrograde pyelogram showed a tumor in the left distal ureter with left proximal hydronephrosis.  Biopsy of the ureteral tumor showed a low- grade TCC.  The patient was initially scheduled for left distal ureterectomy on July 09, 2010.  However, she had pneumonia preoperatively and the surgery was canceled.  She was treated by Dr. Catha Gosselin.  She is now clear for the surgery.  She is now admitted for the procedure.  PAST MEDICAL HISTORY:  Positive for anxiety, asthma, hypercholesterolemia, hypertension.  She has a past history of pulmonary embolism.  PAST SURGICAL HISTORY:  Appendectomy, C-section, hysterectomy.  MEDICATIONS: 1. Alprazolam 0.5 mg. 2. Caltrate 600 plus D. 3. Fish oil. 4. Hydrocodone. 5. Symbicort 160/4.5 mg. 6. Tramadol 50 mg.  ALLERGIES:  She is allergic to PENICILLIN and SULFA.  FAMILY HISTORY:  Her father drowned at age 65.  Her mother had depression and died at age of 89.  She has one daughter.  SOCIAL HISTORY:  She had smoked half pack a day for 50 years and quit in April 2012.  She is a widow.  She does not drink.  REVIEW OF SYSTEMS:  As noted in the HPI and everything else is  negative.  PHYSICAL EXAMINATION:  GENERAL:  This is a well-developed 77 year old female who is in no acute distress.  She is alert and oriented to time, place and person. VITAL SIGNS:  Blood pressure is 160/59, pulse 66, respirations 16, temperature 97.6. HEENT:  Her head is normal.  She has pink conjunctivae.  Ears, nose and throat are within normal limits. NECK:  Supple.  She has no cervical adenopathy.  No thyromegaly. CHEST:  Symmetrical. LUNGS:  Fully expanded and clear to percussion and auscultation. HEART:  Regular rhythm. ABDOMEN:  Soft, nondistended.  There are well-healed surgical scars. She has no CVA tenderness.  Liver, spleen and kidneys are not palpable. Bladder is not distended.  Bowel sounds are normal. GENITALIA:  Meatus is normal.  She has normal female genitalia.  She is status post hysterectomy. EXTREMITIES:  Within normal limits.  IMPRESSION: 1. Left distal ureteral tumor. 2. Hypertension. 3. Asthma. 4. Hypercholesterolemia.     Danae Chen, M.D.     MN/MEDQ  D:  08/13/2010  T:  08/13/2010  Job:  [id number]  Electronically Signed by Lindaann Slough M.D. on 08/26/2010 09:23:03 PM

## 2010-08-26 NOTE — Op Note (Signed)
NAMEJANIRA, [name] NO.:  [id number]  MEDICAL RECORD NO.:  [id number]  LOCATION:  1434                         FACILITY:  [facility]  PHYSICIAN:  Danae Chen, M.D.  DATE OF BIRTH:  05-03-1924  DATE OF PROCEDURE:  08/13/2010 DATE OF DISCHARGE:                              OPERATIVE REPORT   PREOPERATIVE DIAGNOSIS:  Left distal ureteral tumor.  POSTOPERATIVE DIAGNOSIS:  Left distal ureteral tumor.  PROCEDURE DONE:  Left distal ureterectomy and left ureteral reimplantation.  SURGEON:  Danae Chen, MD  ASSISTANT:  Delia Chimes, NP/C.  ANESTHESIA:  General.  INDICATION:  The patient is an 77 year old female who was found during workup for elevated creatinine to have a severe left hydronephrosis. 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  A double-J stent was left in place.  She is now scheduled for left distal ureterectomy with ureteral reimplantation.  The patient was identified by her wristband and proper time-out was taken.  Under general anesthesia, she was prepped and draped, and placed in the supine position.  A #20-French Foley catheter was inserted in the bladder.  A longitudinal incision was made in the suprapubic area from the symphysis pubis to about 2 cm below the umbilicus.  The incision was carried down to the subcutaneous tissues.  The rectus fascia was incised and then the recti muscles were split in the midline.  The bladder was then filled with normal saline.  Two stay sutures of 2-0 chromic were then placed on the anterior wall of the bladder and a cystotomy was done.  The cystotomy was extended down to about 4 cm from the bladder neck.  A Bookwalter retractor was used to provide exposure to the bladder.  The ureteral orifices were identified.  There was a double-J stent coming out of the left ureteral orifice.  The left  ureteral orifice was then secured with #2-0 chromic, then a circumferential incision was made around the left ureteral orifice with a 1 cm margin. Then, the ureteral orifice was sharply dissected with the Metzenbaum scissors from the bladder.  Wide excision of the ureter was done.  It was difficult to find the left distal ureter, however, we found the stent in place.  The ureter was identified and secured with a Babcock clamp.  Then, the distal ureter was dissected sharply and bluntly down to the level of the bladder and the distal ureter was then completely freed from the bladder.  At this point, a Hem-o-lok clip was placed on the ureteral orifice and the ureter was pulled through the incision. Another Hem-o-lok clip was then placed on the distal ureter about 2 cm above the ureteral tumor and the ureter was transected.  The proximal end of the double-J stent was then removed.  The specimen was sent to Pathology for frozen section to find out if the proximal end of the ureter was free of tumor.  While awaiting the results of the frozen section, I closed down the bladder hiatus in 3 layers with #2-0 Vicryl.  A flexible ureteroscope was passed thru the ureter.  There was no evidence  of tumor in the proximal ureter.  Then the frozen section report came back with no evidence of tumor at the margins.  Then, a cystotomy was done and the ureter was brought through the cystotomy for ureteral reimplantation.  There was no tension on the ureter.  The ureter was then spatulated with Potts scissors and the ureter was approximated to the bladder mucosa with #4-0 Vicryl.  The ureter was also secured to the serosa of the bladder with #2-0 chromic.  Then, a double-J stent was passed through the ureter with the proximal end in the kidney and the distal end in the bladder.  The wound was then irrigated with normal saline.  The right ureteral orifice was clearly visualized with good efflux from the right  ureteral orifice.  Then, the cystotomy was closed in 2 layers with #2-0 Vicryl.  A Blake drain was placed through a stab wound and placed in the Retzius space.  A small rent in the peritoneum was closed with #2-0 Vicryl.  The wound was then irrigated with normal saline.  Exparel was then injected in the muscle of the surgical site for postsurgical anesthesia.  Then, the fascia was closed with #1 PDS and the skin was closed with skin staples.   A #20-French Foley catheter that was previously inserted in the bladder was left to straight drainage.  Estimated blood loss 175 cc.  Needle, sponge, and instrument counts were correct on 2 occasions.  The patient tolerated the procedure well and left the OR in satisfactory condition to post-anesthesia care unit.     Danae Chen, M.D.     MN/MEDQ  D:  08/13/2010  T:  08/14/2010  Job:  [id number]  Electronically Signed by Lindaann Slough M.D. on 08/26/2010 09:29:04 PM

## 2010-08-26 NOTE — Discharge Summary (Signed)
  NAMENEHA, WAIGHT NO.:  [id number]  MEDICAL RECORD NO.:  [id number]  LOCATION:  1434                         FACILITY:  [facility]  PHYSICIAN:  Danae Chen, M.D.  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  PROCEDURES:  Left distal ureterectomy and ureteral reimplantation and ureteroscopy.  HISTORY:  The patient is an 77 year old female who was found during workup for elevated creatinine to have severe left hydroureteronephrosis.  A CT scan showed severe left hydronephrosis. Cystoscopy and retrograde pyelogram showed a papillary tumor in the left distal ureter.  The biopsy of the ureteral tumor showed a low-grade transitional cell carcinoma.  She was admitted on August 17, 2010, for distal ureterectomy and ureteral reimplantation.  On physical examination, her blood pressure was 160/59 on admission, pulse 66, respirations 16, temperature 97.6.  Lungs were clear.  Heart:  regular rhythm.  Abdomen was soft and nondistended and nontender. No CVA tenderness.  Liver, spleen and kidneys not palpable.  Bladder not distended.  Genitalia, she had normal female genitalia.  She is status post hysterectomy.  Her hemoglobin on admission was 12.5, hematocrit 38.2 and WBC 8.5.  BUN 20, creatinine 1.27, glucose 104.  Sodium 138, potassium 4.8, calcium 10.2.  She had a left distal ureterectomy and left ureteral reimplantation on August 13, 2010.  Her postop course was uneventful.  She remained afebrile.  She was started on clear liquid diet the first day postop which she tolerated well.  Her diet was gradually advanced.  The drainage from the [facility] drain was minimal and was removed on the third day postop.  Foley catheter was draining blood tinged urine.  She complained of difficulty swallowing  which she had before and was evaluated by Dr. Catha Gosselin and her hemoglobin on August 16, 2010, was 9.4, hematocrit 30 and WBC 9.6. BUN 18, creatinine 1.19, glucose 114, sodium 137, potassium 4.3. Pathology report showed low-grade urothelial cancer of the distal ureter with clear margins.  The patient was then discharged home on August 18, 2010, on regular diet and on all home medications; on Cipro 250 mg daily, Amitiza one daily, hydrocodone/APAP 5/325 one or two tablets q.4 h p.r.n. for pain.  The Foley catheter will be left indwelling for 2 weeks and the double-J stent will be left in place for about 6 weeks.  The patient will be followed as an outpatient by Dr. Catha Gosselin for further evaluation of difficulty swallowing.  CONDITION ON DISCHARGE:  Improved.  She is instructed not to do any lifting, straining or driving until further advised.  She will return to the office in 1 week for skin staples removal.     Danae Chen, M.D.     MN/MEDQ  D:  08/18/2010  T:  08/18/2010  Job:  [id number]  cc:   Caryn Bee L. Little, M.D. Fax: [phone]  Electronically Signed by Lindaann Slough M.D. on 08/26/2010 09:19:42 PM

## 2010-09-16 ENCOUNTER — Encounter (HOSPITAL_COMMUNITY): Payer: Medicare Other | Attending: Nephrology

## 2010-09-16 DIAGNOSIS — D509 Iron deficiency anemia, unspecified: Secondary | ICD-10-CM | POA: Insufficient documentation

## 2010-09-23 ENCOUNTER — Encounter (HOSPITAL_COMMUNITY): Payer: Medicare Other

## 2010-09-24 ENCOUNTER — Other Ambulatory Visit: Payer: Self-pay | Admitting: Family Medicine

## 2010-09-24 ENCOUNTER — Ambulatory Visit
Admission: RE | Admit: 2010-09-24 | Discharge: 2010-09-24 | Disposition: A | Discharge status: Home / Self Care | Payer: Medicare Other | Source: Ambulatory Visit | Attending: Family Medicine | Admitting: Family Medicine

## 2010-09-24 DIAGNOSIS — R609 Edema, unspecified: Secondary | ICD-10-CM

## 2010-09-24 DIAGNOSIS — R52 Pain, unspecified: Secondary | ICD-10-CM

## 2010-11-02 ENCOUNTER — Encounter: Payer: Self-pay | Admitting: *Deleted

## 2010-12-01 ENCOUNTER — Encounter: Payer: Self-pay | Admitting: *Deleted

## 2010-12-11 ENCOUNTER — Telehealth: Payer: Self-pay | Admitting: Oncology

## 2010-12-11 NOTE — Telephone Encounter (Signed)
S/w pt to r/s her 11/8 lab/md appt for Jan 2013. Pt asked if she really needs this appt. I advised pt to call back on Monday and ask to speak with MD's rn to find out whether or not she needs to come in.

## 2010-12-31 ENCOUNTER — Telehealth: Payer: Self-pay | Admitting: Oncology

## 2010-12-31 ENCOUNTER — Other Ambulatory Visit: Payer: Self-pay | Admitting: Oncology

## 2010-12-31 DIAGNOSIS — D729 Disorder of white blood cells, unspecified: Secondary | ICD-10-CM

## 2010-12-31 NOTE — Telephone Encounter (Signed)
S/w pt re appt for 1/24 @ 11:30 am.

## 2011-02-17 ENCOUNTER — Ambulatory Visit (HOSPITAL_BASED_OUTPATIENT_CLINIC_OR_DEPARTMENT_OTHER): Payer: Medicare Other | Admitting: Oncology

## 2011-02-17 ENCOUNTER — Other Ambulatory Visit: Payer: Medicare Other | Admitting: Lab

## 2011-02-17 VITALS — BP 183/72 | HR 51 | Temp 97.2°F | Ht <= 58 in | Wt 98.6 lb

## 2011-02-17 DIAGNOSIS — I10 Essential (primary) hypertension: Secondary | ICD-10-CM

## 2011-02-17 DIAGNOSIS — D729 Disorder of white blood cells, unspecified: Secondary | ICD-10-CM

## 2011-02-17 DIAGNOSIS — D7289 Other specified disorders of white blood cells: Secondary | ICD-10-CM

## 2011-02-17 LAB — CBC WITH DIFFERENTIAL/PLATELET
Basophils Absolute: 0 10*3/uL (ref 0.0–0.1)
Eosinophils Absolute: 0.1 10*3/uL (ref 0.0–0.5)
HGB: 14.1 g/dL (ref 11.6–15.9)
MCV: 95.4 fL (ref 79.5–101.0)
MONO%: 8 % (ref 0.0–14.0)
NEUT#: 5.5 10*3/uL (ref 1.5–6.5)
RDW: 14.4 % (ref 11.2–14.5)

## 2011-02-17 NOTE — Progress Notes (Signed)
Hematology and Oncology Follow Up Visit  [name] [id number] 1924-02-18 78 y.o. 02/17/2011 12:49 PM    CC: [name], M.D.  [name] Section, M.D.    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 This is likely an MGUS    Current therapy: Observation and surveillance.   Interim History:  [name] presents today for follow up visit. She is reporting some lower back pain predominantly in the middle of her back, also with radiation around the left side of her pelvis.  She did not report any constant pain, did not report any pathological fractures, did not report any recent hospitalization for any illnesses.  She has continued to live independently although she does not drive, but attends to her activities of daily living without any hindrance or decline.  Appetite is excellent.  She has not had any major changes in that.   She has been a symptomatic since the last time visit.   Medications: I have reviewed the patient's current medications. Current outpatient prescriptions:losartan (COZAAR) 25 MG tablet, Take 25 mg by mouth daily., Disp: , Rfl: ;  budesonide-formoterol (SYMBICORT) 160-4.5 MCG/ACT inhaler, Inhale 2 puffs into the lungs 2 (two) times daily.  , Disp: , Rfl: ;  Calcium Carbonate-Vitamin D (CALCIUM + D PO), Take by mouth daily.  , Disp: , Rfl: ;  fish oil-omega-3 fatty acids 1000 MG capsule, Take 2 g by mouth daily.  , Disp: , Rfl:  multivitamin-lutein (OCUVITE-LUTEIN) CAPS, Take 1 capsule by mouth daily.  , Disp: , Rfl:   Allergies:  Allergies  Allergen Reactions  . Penicillins   . Sulfa Antibiotics     Past Medical History, Surgical history, Social history, and Family History were reviewed and updated.  Review of Systems: Constitutional:  Negative for fever, chills, night sweats, anorexia, weight loss, pain. Cardiovascular: no chest pain or dyspnea on exertion Respiratory: no  cough, shortness of breath, or wheezing Neurological: no TIA or stroke symptoms Dermatological: negative ENT: negative Skin: Negative. Gastrointestinal: no abdominal pain, change in bowel habits, or black or bloody stools Genito-Urinary: no dysuria, trouble voiding, or hematuria Hematological and Lymphatic: negative Breast: negative Musculoskeletal: negative Remaining ROS negative. Physical Exam: Blood pressure 183/72, pulse 51, temperature 97.2 F (36.2 C), temperature source Oral, height 4' 4.3" (1.328 m), weight 98 lb 9.6 oz (44.725 kg). ECOG: 1 General appearance: alert Head: Normocephalic, without obvious abnormality, atraumatic Neck: no adenopathy, no carotid bruit, no JVD, supple, symmetrical, trachea midline and thyroid not enlarged, symmetric, no tenderness/mass/nodules Lymph nodes: Cervical, supraclavicular, and axillary nodes normal. Heart:regular rate and rhythm, S1, S2 normal, no murmur, click, rub or gallop Lung:chest clear, no wheezing, rales, normal symmetric air entry Abdomin: soft, non-tender, without masses or organomegaly EXT:no erythema, induration, or nodules   Lab Results: Lab Results  Component Value Date   WBC 8.7 02/17/2011   HGB 14.1 02/17/2011   HCT 42.7 02/17/2011   MCV 95.4 02/17/2011   PLT 216 02/17/2011     Chemistry      Component Value Date/Time   NA 137 08/19/2010 1052   K 4.2 08/19/2010 1052   CL 103 08/19/2010 1052   CO2 31 08/16/2010 0435   BUN 26* 08/19/2010 1052   CREATININE 1.30* 08/19/2010 1052      Component Value Date/Time   CALCIUM 8.5 08/16/2010 0435   ALKPHOS 124* 08/05/2010 1300   AST 43* 08/05/2010 1300   ALT  52* 08/05/2010 1300   BILITOT 0.2* 08/05/2010 1300       Impression and Plan:   78 year old with the following issues:  1. Monoclonal protein IgG subtype indicating an M-spike around 0.1 gm/dL. The differential diagnosis will include at this point a monoclonal gammopathy of undetermined significance versus multiple  myeloma versus amyloidosis versus a reactive monoclonal protein.  In all likely we probably are dealing with monoclonal gammopathy of undetermined significance at this point. I am repeating her protein studies today and in 6 months. No evidence of end organ damage to suggest multiple myeloma.  2. Anemia: Hgb is stable today. No anemia noted.  3. Hypertension: She is following up with her PCP today.      Cynthia Hose, MD 1/24/201312:49 PM

## 2011-02-18 ENCOUNTER — Telehealth: Payer: Self-pay | Admitting: Oncology

## 2011-02-18 NOTE — Telephone Encounter (Signed)
S/w pt re appt for 7/24

## 2011-02-21 LAB — SPEP & IFE WITH QIG
Albumin ELP: 59.1 % (ref 55.8–66.1)
Alpha-1-Globulin: 4.6 % (ref 2.9–4.9)
Alpha-2-Globulin: 11.1 % (ref 7.1–11.8)
Beta Globulin: 5.3 % (ref 4.7–7.2)
IgA: 276 mg/dL (ref 69–380)
Total Protein, Serum Electrophoresis: 7.1 g/dL (ref 6.0–8.3)

## 2011-02-21 LAB — KAPPA/LAMBDA LIGHT CHAINS
Kappa free light chain: 2.51 mg/dL — ABNORMAL HIGH (ref 0.33–1.94)
Kappa:Lambda Ratio: 1.31 (ref 0.26–1.65)
Lambda Free Lght Chn: 1.91 mg/dL (ref 0.57–2.63)

## 2011-02-21 LAB — COMPREHENSIVE METABOLIC PANEL
Albumin: 4.6 g/dL (ref 3.5–5.2)
Alkaline Phosphatase: 66 U/L (ref 39–117)
BUN: 32 mg/dL — ABNORMAL HIGH (ref 6–23)
Calcium: 9.7 mg/dL (ref 8.4–10.5)
Chloride: 102 mEq/L (ref 96–112)
Glucose, Bld: 79 mg/dL (ref 70–99)
Potassium: 4.9 mEq/L (ref 3.5–5.3)

## 2011-02-22 ENCOUNTER — Other Ambulatory Visit (HOSPITAL_COMMUNITY): Payer: Self-pay | Admitting: Family Medicine

## 2011-02-22 ENCOUNTER — Ambulatory Visit (HOSPITAL_COMMUNITY): Payer: Medicare Other | Attending: Cardiology | Admitting: Radiology

## 2011-02-22 DIAGNOSIS — I10 Essential (primary) hypertension: Secondary | ICD-10-CM | POA: Insufficient documentation

## 2011-02-22 DIAGNOSIS — E785 Hyperlipidemia, unspecified: Secondary | ICD-10-CM | POA: Insufficient documentation

## 2011-02-22 DIAGNOSIS — I359 Nonrheumatic aortic valve disorder, unspecified: Secondary | ICD-10-CM | POA: Insufficient documentation

## 2011-02-22 HISTORY — PX: TRANSTHORACIC ECHOCARDIOGRAM: SHX275

## 2011-02-23 ENCOUNTER — Encounter (HOSPITAL_COMMUNITY): Payer: Self-pay | Admitting: Family Medicine

## 2011-06-06 ENCOUNTER — Encounter: Payer: Self-pay | Admitting: *Deleted

## 2011-08-17 ENCOUNTER — Telehealth: Payer: Self-pay | Admitting: Oncology

## 2011-08-17 ENCOUNTER — Ambulatory Visit (HOSPITAL_BASED_OUTPATIENT_CLINIC_OR_DEPARTMENT_OTHER): Payer: Medicare Other | Admitting: Oncology

## 2011-08-17 ENCOUNTER — Other Ambulatory Visit (HOSPITAL_BASED_OUTPATIENT_CLINIC_OR_DEPARTMENT_OTHER): Payer: Medicare Other | Admitting: Lab

## 2011-08-17 VITALS — BP 172/81 | HR 55 | Temp 97.0°F | Ht <= 58 in | Wt 96.4 lb

## 2011-08-17 DIAGNOSIS — D7289 Other specified disorders of white blood cells: Secondary | ICD-10-CM

## 2011-08-17 DIAGNOSIS — D729 Disorder of white blood cells, unspecified: Secondary | ICD-10-CM

## 2011-08-17 LAB — CBC WITH DIFFERENTIAL/PLATELET
BASO%: 0.5 % (ref 0.0–2.0)
EOS%: 1.8 % (ref 0.0–7.0)
MCH: 31.6 pg (ref 25.1–34.0)
MCHC: 32.9 g/dL (ref 31.5–36.0)
RBC: 4.46 10*6/uL (ref 3.70–5.45)
RDW: 13.9 % (ref 11.2–14.5)
lymph#: 1.9 10*3/uL (ref 0.9–3.3)

## 2011-08-17 NOTE — Progress Notes (Signed)
Hematology and Oncology Follow Up Visit  [name] [id number] 07/09/1924 78 y.o. 08/17/2011 12:58 PM    CC: [name], M.D.  [name] Section, M.D.    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 This is likely an MGUS  Current therapy: Observation and surveillance.   Interim History:  [name] presents today for follow up visit. She is reporting some lower back pain predominantly in the middle of her back, also with radiation around the left side of her pelvis. This has not changed from her last visit.  She did not report any constant pain, did not report any pathological fractures, did not report any recent hospitalization for any illnesses.  She has continued to live independently although she does not drive, but attends to her activities of daily living without any hindrance or decline.  Appetite is excellent.  She has not had any major changes in that.     Medications: I have reviewed the patient's current medications. Current outpatient prescriptions:budesonide-formoterol (SYMBICORT) 160-4.5 MCG/ACT inhaler, Inhale 2 puffs into the lungs 2 (two) times daily.  , Disp: , Rfl: ;  Calcium Carbonate-Vitamin D (CALCIUM + D PO), Take by mouth daily.  , Disp: , Rfl: ;  fish oil-omega-3 fatty acids 1000 MG capsule, Take 2 g by mouth daily.  , Disp: , Rfl: ;  losartan (COZAAR) 25 MG tablet, Take 25 mg by mouth daily., Disp: , Rfl:  multivitamin-lutein (OCUVITE-LUTEIN) CAPS, Take 1 capsule by mouth daily.  , Disp: , Rfl:   Allergies:  Allergies  Allergen Reactions  . Penicillins   . Sulfa Antibiotics     Past Medical History, Surgical history, Social history, and Family History were reviewed and updated.  Review of Systems: Constitutional:  Negative for fever, chills, night sweats, anorexia, weight loss, pain. Cardiovascular: no chest pain or dyspnea on exertion Respiratory: no cough,  shortness of breath, or wheezing Neurological: no TIA or stroke symptoms Dermatological: negative ENT: negative Skin: Negative. Gastrointestinal: no abdominal pain, change in bowel habits, or black or bloody stools Genito-Urinary: no dysuria, trouble voiding, or hematuria Hematological and Lymphatic: negative Breast: negative Musculoskeletal: negative Remaining ROS negative. Physical Exam: Blood pressure 172/81, pulse 55, temperature 97 F (36.1 C), temperature source Oral, height 4\' 4"  (1.321 m), weight 96 lb 6.4 oz (43.727 kg). ECOG: 1 General appearance: alert Head: Normocephalic, without obvious abnormality, atraumatic Neck: no adenopathy, no carotid bruit, no JVD, supple, symmetrical, trachea midline and thyroid not enlarged, symmetric, no tenderness/mass/nodules Lymph nodes: Cervical, supraclavicular, and axillary nodes normal. Heart:regular rate and rhythm, S1, S2 normal, no murmur, click, rub or gallop Lung:chest clear, no wheezing, rales, normal symmetric air entry Abdomin: soft, non-tender, without masses or organomegaly EXT:no erythema, induration, or nodules   Lab Results: Lab Results  Component Value Date   WBC 7.5 08/17/2011   HGB 14.1 08/17/2011   HCT 42.8 08/17/2011   MCV 96.1 08/17/2011   PLT 216 08/17/2011     Chemistry      Component Value Date/Time   NA 140 02/17/2011 1138   K 4.9 02/17/2011 1138   CL 102 02/17/2011 1138   CO2 27 02/17/2011 1138   BUN 32* 02/17/2011 1138   CREATININE 1.35* 02/17/2011 1138      Component Value Date/Time   CALCIUM 9.7 02/17/2011 1138   ALKPHOS 66 02/17/2011 1138   AST 39* 02/17/2011 1138   ALT 29 02/17/2011 1138  BILITOT 0.5 02/17/2011 1138       Impression and Plan:   78 year old with the following issues:  1. Monoclonal protein IgG subtype indicating an M-spike less than0.1 gm/dL. The differential diagnosis will include at this point a monoclonal gammopathy of undetermined significance versus multiple myeloma versus  amyloidosis versus a reactive monoclonal protein.  In all likely we probably are dealing with monoclonal gammopathy of undetermined significance at this point. I am repeating her protein studies today and in 12 months. No evidence of end organ damage to suggest multiple myeloma.  2. Anemia: Hgb is stable today. No anemia noted.  3. Hypertension: She is following up with her PCP today.      Cynthia Hose, MD 7/24/201312:58 PM

## 2011-08-17 NOTE — Telephone Encounter (Signed)
Gave pt appt for next year,July 2014 lab and MD

## 2011-08-19 LAB — KAPPA/LAMBDA LIGHT CHAINS
Kappa free light chain: 1.75 mg/dL (ref 0.33–1.94)
Kappa:Lambda Ratio: 0.95 (ref 0.26–1.65)

## 2011-08-19 LAB — SPEP & IFE WITH QIG
Albumin ELP: 59.8 % (ref 55.8–66.1)
Alpha-1-Globulin: 4.4 % (ref 2.9–4.9)
Alpha-2-Globulin: 11.6 % (ref 7.1–11.8)
Gamma Globulin: 13.5 % (ref 11.1–18.8)

## 2011-08-19 LAB — COMPREHENSIVE METABOLIC PANEL
ALT: 23 U/L (ref 0–35)
AST: 30 U/L (ref 0–37)
Albumin: 4.1 g/dL (ref 3.5–5.2)
Alkaline Phosphatase: 64 U/L (ref 39–117)
Calcium: 9.7 mg/dL (ref 8.4–10.5)
Chloride: 103 mEq/L (ref 96–112)
Creatinine, Ser: 1.18 mg/dL — ABNORMAL HIGH (ref 0.50–1.10)
Potassium: 4.7 mEq/L (ref 3.5–5.3)

## 2011-11-30 ENCOUNTER — Encounter: Payer: Self-pay | Admitting: Cardiology

## 2012-03-13 ENCOUNTER — Encounter: Payer: Self-pay | Admitting: Cardiology

## 2012-07-12 ENCOUNTER — Other Ambulatory Visit: Payer: Self-pay | Admitting: Family Medicine

## 2012-07-12 DIAGNOSIS — M8580 Other specified disorders of bone density and structure, unspecified site: Secondary | ICD-10-CM

## 2012-07-31 ENCOUNTER — Other Ambulatory Visit: Payer: Medicare Other

## 2012-08-08 ENCOUNTER — Other Ambulatory Visit: Payer: Self-pay | Admitting: Urology

## 2012-08-13 ENCOUNTER — Encounter (HOSPITAL_BASED_OUTPATIENT_CLINIC_OR_DEPARTMENT_OTHER): Payer: Self-pay | Admitting: *Deleted

## 2012-08-14 ENCOUNTER — Encounter (HOSPITAL_BASED_OUTPATIENT_CLINIC_OR_DEPARTMENT_OTHER): Payer: Self-pay | Admitting: *Deleted

## 2012-08-14 NOTE — Progress Notes (Signed)
SPOKE W/ PT AND DAUGHTER. NPO AFTER MN. ARRIVES AT 1015. NEEDS ISTAT, CXR AND EKG. WILL TAKE LOSARTAN AND DO SYMBICORT INHALER AM OF SURG W/ SIPS OF WATER.

## 2012-08-16 ENCOUNTER — Ambulatory Visit: Payer: Medicare Other | Admitting: Oncology

## 2012-08-16 ENCOUNTER — Other Ambulatory Visit: Payer: Medicare Other | Admitting: Lab

## 2012-08-21 ENCOUNTER — Ambulatory Visit (HOSPITAL_BASED_OUTPATIENT_CLINIC_OR_DEPARTMENT_OTHER): Payer: Medicare Other | Admitting: Anesthesiology

## 2012-08-21 ENCOUNTER — Encounter (HOSPITAL_BASED_OUTPATIENT_CLINIC_OR_DEPARTMENT_OTHER): Payer: Self-pay | Admitting: Anesthesiology

## 2012-08-21 ENCOUNTER — Ambulatory Visit (HOSPITAL_COMMUNITY): Payer: Medicare Other

## 2012-08-21 ENCOUNTER — Encounter (HOSPITAL_BASED_OUTPATIENT_CLINIC_OR_DEPARTMENT_OTHER)
Admission: RE | Disposition: A | Discharge status: Home / Self Care | Payer: Self-pay | Source: Ambulatory Visit | Attending: Urology

## 2012-08-21 ENCOUNTER — Other Ambulatory Visit: Payer: Self-pay

## 2012-08-21 ENCOUNTER — Ambulatory Visit (HOSPITAL_BASED_OUTPATIENT_CLINIC_OR_DEPARTMENT_OTHER)
Admission: RE | Admit: 2012-08-21 | Discharge: 2012-08-21 | Disposition: A | Discharge status: Home / Self Care | Payer: Medicare Other | Source: Ambulatory Visit | Attending: Urology | Admitting: Urology

## 2012-08-21 DIAGNOSIS — Z87891 Personal history of nicotine dependence: Secondary | ICD-10-CM | POA: Insufficient documentation

## 2012-08-21 DIAGNOSIS — Z85828 Personal history of other malignant neoplasm of skin: Secondary | ICD-10-CM | POA: Insufficient documentation

## 2012-08-21 DIAGNOSIS — I10 Essential (primary) hypertension: Secondary | ICD-10-CM | POA: Insufficient documentation

## 2012-08-21 DIAGNOSIS — E7800 Pure hypercholesterolemia, unspecified: Secondary | ICD-10-CM | POA: Insufficient documentation

## 2012-08-21 DIAGNOSIS — J4489 Other specified chronic obstructive pulmonary disease: Secondary | ICD-10-CM | POA: Insufficient documentation

## 2012-08-21 DIAGNOSIS — J449 Chronic obstructive pulmonary disease, unspecified: Secondary | ICD-10-CM | POA: Insufficient documentation

## 2012-08-21 DIAGNOSIS — Z906 Acquired absence of other parts of urinary tract: Secondary | ICD-10-CM | POA: Insufficient documentation

## 2012-08-21 DIAGNOSIS — C679 Malignant neoplasm of bladder, unspecified: Secondary | ICD-10-CM | POA: Insufficient documentation

## 2012-08-21 DIAGNOSIS — Z86711 Personal history of pulmonary embolism: Secondary | ICD-10-CM | POA: Insufficient documentation

## 2012-08-21 HISTORY — DX: Nonrheumatic aortic (valve) stenosis: I35.0

## 2012-08-21 HISTORY — DX: Hyperlipidemia, unspecified: E78.5

## 2012-08-21 HISTORY — DX: Chronic kidney disease, stage 3 unspecified: N18.30

## 2012-08-21 HISTORY — DX: Occlusion and stenosis of right carotid artery: I65.21

## 2012-08-21 HISTORY — DX: Monoclonal gammopathy: D47.2

## 2012-08-21 HISTORY — DX: Chronic kidney disease, stage 3 (moderate): N18.3

## 2012-08-21 HISTORY — DX: Other allergy status, other than to drugs and biological substances: Z91.09

## 2012-08-21 HISTORY — PX: CYSTOSCOPY/RETROGRADE/URETEROSCOPY: SHX5316

## 2012-08-21 HISTORY — DX: Personal history of malignant neoplasm of ureter: Z85.54

## 2012-08-21 HISTORY — DX: Gastro-esophageal reflux disease without esophagitis: K21.9

## 2012-08-21 HISTORY — DX: Diverticulosis of large intestine without perforation or abscess without bleeding: K57.30

## 2012-08-21 HISTORY — PX: TRANSURETHRAL RESECTION OF BLADDER TUMOR: SHX2575

## 2012-08-21 HISTORY — DX: Neoplasm of unspecified behavior of bladder: D49.4

## 2012-08-21 HISTORY — DX: Personal history of pulmonary embolism: Z86.711

## 2012-08-21 LAB — POCT I-STAT 4, (NA,K, GLUC, HGB,HCT): Sodium: 142 mEq/L (ref 135–145)

## 2012-08-21 SURGERY — TURBT (TRANSURETHRAL RESECTION OF BLADDER TUMOR)
Anesthesia: General | Site: Bladder | Wound class: Clean Contaminated

## 2012-08-21 MED ORDER — FENTANYL CITRATE 0.05 MG/ML IJ SOLN
INTRAMUSCULAR | Status: DC | PRN
  Administered 2012-08-21 (×2): 25 ug via INTRAVENOUS
  Administered 2012-08-21: 50 ug via INTRAVENOUS

## 2012-08-21 MED ORDER — PROPOFOL 10 MG/ML IV BOLUS
INTRAVENOUS | Status: DC | PRN
  Administered 2012-08-21: 70 mg via INTRAVENOUS

## 2012-08-21 MED ORDER — ONDANSETRON HCL 4 MG/2ML IJ SOLN
INTRAMUSCULAR | Status: DC | PRN
  Administered 2012-08-21: 4 mg via INTRAVENOUS

## 2012-08-21 MED ORDER — LACTATED RINGERS IV SOLN
INTRAVENOUS | Status: DC
  Administered 2012-08-21: 12:00:00 via INTRAVENOUS
  Filled 2012-08-21: qty 1000

## 2012-08-21 MED ORDER — IOHEXOL 350 MG/ML SOLN
INTRAVENOUS | Status: DC | PRN
  Administered 2012-08-21: 13 mL via URETHRAL

## 2012-08-21 MED ORDER — LACTATED RINGERS IV SOLN
INTRAVENOUS | Status: DC
  Filled 2012-08-21: qty 1000

## 2012-08-21 MED ORDER — LIDOCAINE HCL (CARDIAC) 20 MG/ML IV SOLN
INTRAVENOUS | Status: DC | PRN
  Administered 2012-08-21: 40 mg via INTRAVENOUS

## 2012-08-21 MED ORDER — FENTANYL CITRATE 0.05 MG/ML IJ SOLN
25.0000 ug | INTRAMUSCULAR | Status: DC | PRN
  Filled 2012-08-21: qty 1

## 2012-08-21 MED ORDER — CIPROFLOXACIN IN D5W 400 MG/200ML IV SOLN
400.0000 mg | INTRAVENOUS | Status: AC
  Administered 2012-08-21: 400 mg via INTRAVENOUS
  Filled 2012-08-21: qty 200

## 2012-08-21 SURGICAL SUPPLY — 59 items
ADAPTER CATH URET PLST 4-6FR (CATHETERS) IMPLANT
ADPR CATH URET STRL DISP 4-6FR (CATHETERS)
BAG DRAIN URO-CYSTO SKYTR STRL (DRAIN) ×3 IMPLANT
BAG DRN ANRFLXCHMBR STRAP LEK (BAG) ×2
BAG DRN UROCATH (DRAIN) ×2
BAG URINE DRAINAGE (UROLOGICAL SUPPLIES) IMPLANT
BAG URINE LEG 19OZ MD ST LTX (BAG) ×1 IMPLANT
BASKET LASER NITINOL 1.9FR (BASKET) IMPLANT
BASKET STNLS GEMINI 4WIRE 3FR (BASKET) IMPLANT
BASKET ZERO TIP NITINOL 2.4FR (BASKET) IMPLANT
BRUSH URET BIOPSY 3F (UROLOGICAL SUPPLIES) IMPLANT
BSKT STON RTRVL 120 1.9FR (BASKET)
BSKT STON RTRVL GEM 120X11 3FR (BASKET)
BSKT STON RTRVL ZERO TP 2.4FR (BASKET)
CANISTER SUCT LVC 12 LTR MEDI- (MISCELLANEOUS) ×2 IMPLANT
CATH FOLEY 2WAY SLVR  5CC 16FR (CATHETERS) ×1
CATH FOLEY 2WAY SLVR  5CC 20FR (CATHETERS)
CATH FOLEY 2WAY SLVR  5CC 22FR (CATHETERS)
CATH FOLEY 2WAY SLVR 5CC 16FR (CATHETERS) IMPLANT
CATH FOLEY 2WAY SLVR 5CC 20FR (CATHETERS) IMPLANT
CATH FOLEY 2WAY SLVR 5CC 22FR (CATHETERS) IMPLANT
CATH INTERMIT  6FR 70CM (CATHETERS) ×1 IMPLANT
CATH URET 5FR 28IN CONE TIP (BALLOONS) ×1
CATH URET 5FR 28IN OPEN ENDED (CATHETERS) IMPLANT
CATH URET 5FR 70CM CONE TIP (BALLOONS) IMPLANT
CLOTH BEACON ORANGE TIMEOUT ST (SAFETY) ×3 IMPLANT
DRAPE CAMERA CLOSED 9X96 (DRAPES) ×3 IMPLANT
ELECT LOOP HF 26F 30D .35MM (CUTTING LOOP) IMPLANT
ELECT LOOP MED HF 24F 12D CBL (CLIP) ×1 IMPLANT
ELECT REM PT RETURN 9FT ADLT (ELECTROSURGICAL) ×3
ELECTRODE REM PT RTRN 9FT ADLT (ELECTROSURGICAL) ×2 IMPLANT
EVACUATOR MICROVAS BLADDER (UROLOGICAL SUPPLIES) ×2 IMPLANT
GLOVE BIO SURGEON STRL SZ 6 (GLOVE) ×1 IMPLANT
GLOVE BIO SURGEON STRL SZ 6.5 (GLOVE) ×1 IMPLANT
GLOVE BIO SURGEON STRL SZ7 (GLOVE) ×3 IMPLANT
GLOVE BIOGEL PI IND STRL 6.5 (GLOVE) IMPLANT
GLOVE BIOGEL PI INDICATOR 6.5 (GLOVE) ×1
GLOVE ECLIPSE 6.5 STRL STRAW (GLOVE) ×1 IMPLANT
GOWN PREVENTION PLUS LG XLONG (DISPOSABLE) ×4 IMPLANT
GUIDEWIRE 0.038 PTFE COATED (WIRE) IMPLANT
GUIDEWIRE ANG ZIPWIRE 038X150 (WIRE) ×1 IMPLANT
GUIDEWIRE STR DUAL SENSOR (WIRE) ×1 IMPLANT
HOLDER FOLEY CATH W/STRAP (MISCELLANEOUS) IMPLANT
IV NS IRRIG 3000ML ARTHROMATIC (IV SOLUTION) ×7 IMPLANT
KIT ASPIRATION TUBING (SET/KITS/TRAYS/PACK) ×1 IMPLANT
KIT BALLIN UROMAX 15FX10 (LABEL) IMPLANT
KIT BALLN UROMAX 15FX4 (MISCELLANEOUS) IMPLANT
KIT BALLN UROMAX 26 75X4 (MISCELLANEOUS)
LASER FIBER DISP (UROLOGICAL SUPPLIES) IMPLANT
LASER FIBER DISP 1000U (UROLOGICAL SUPPLIES) IMPLANT
LOOP CUTTING 24FR OLYMPUS (CUTTING LOOP) IMPLANT
PACK CYSTOSCOPY (CUSTOM PROCEDURE TRAY) ×3 IMPLANT
PLUG CATH AND CAP STER (CATHETERS) IMPLANT
SET ASPIRATION TUBING (TUBING) IMPLANT
SET HIGH PRES BAL DIL (LABEL)
SHEATH ACCESS URETERAL 38CM (SHEATH) IMPLANT
SHEATH ACCESS URETERAL 54CM (SHEATH) IMPLANT
SYRINGE 10CC LL (SYRINGE) ×1 IMPLANT
SYRINGE IRR TOOMEY STRL 70CC (SYRINGE) ×1 IMPLANT

## 2012-08-21 NOTE — Anesthesia Preprocedure Evaluation (Addendum)
Anesthesia Evaluation  Patient identified by MRN, date of birth, ID band Patient awake    Reviewed: Allergy & Precautions, H&P , NPO status , Patient's Chart, lab work & pertinent test results  Airway Mallampati: II TM Distance: >3 FB Neck ROM: full    Dental no notable dental hx. (+) Dental Advisory Given, Caps and Missing All right upper side missing.  Caps all left upper front and lateral incissors.:   Pulmonary COPD COPD inhaler, Current Smoker,  Hx. PE breath sounds clear to auscultation  Pulmonary exam normal       Cardiovascular hypertension, + Valvular Problems/Murmurs AS Rhythm:regular Rate:Normal  Mild AS   Neuro/Psych negative neurological ROS  negative psych ROS   GI/Hepatic negative GI ROS, Neg liver ROS,   Endo/Other  negative endocrine ROS  Renal/GU Renal diseaseStage 3 chronic kidney disease  negative genitourinary   Musculoskeletal   Abdominal   Peds  Hematology negative hematology ROS (+)   Anesthesia Other Findings   Reproductive/Obstetrics negative OB ROS                          Anesthesia Physical Anesthesia Plan  ASA: III  Anesthesia Plan: General   Post-op Pain Management:    Induction: Intravenous  Airway Management Planned: LMA  Additional Equipment:   Intra-op Plan:   Post-operative Plan:   Informed Consent: I have reviewed the patients History and Physical, chart, labs and discussed the procedure including the risks, benefits and alternatives for the proposed anesthesia with the patient or authorized representative who has indicated his/her understanding and acceptance.   Dental Advisory Given  Plan Discussed with: CRNA and Surgeon  Anesthesia Plan Comments:         Anesthesia Quick Evaluation

## 2012-08-21 NOTE — Transfer of Care (Signed)
Immediate Anesthesia Transfer of Care Note  Patient: [name]  Procedure(s) Performed: Procedure(s) (LRB): TRANSURETHRAL RESECTION OF BLADDER TUMOR (TURBT) (N/A) CYSTOSCOPY/RETROGRADE/URETEROSCOPY (Left)  Patient Location: PACU  Anesthesia Type: General  Level of Consciousness: awake, alert  and oriented  Airway & Oxygen Therapy: Patient Spontanous Breathing and Patient connected to face mask oxygen  Post-op Assessment: Report given to PACU RN and Post -op Vital signs reviewed and stable  Post vital signs: Reviewed and stable  Complications: No apparent anesthesia complications

## 2012-08-21 NOTE — Anesthesia Postprocedure Evaluation (Signed)
  Anesthesia Post-op Note  Patient: [name]  Procedure(s) Performed: Procedure(s) (LRB): TRANSURETHRAL RESECTION OF BLADDER TUMOR (TURBT) (N/A) CYSTOSCOPY/RETROGRADE/URETEROSCOPY (Left)  Patient Location: PACU  Anesthesia Type: General  Level of Consciousness: awake and alert   Airway and Oxygen Therapy: Patient Spontanous Breathing  Post-op Pain: mild  Post-op Assessment: Post-op Vital signs reviewed, Patient's Cardiovascular Status Stable, Respiratory Function Stable, Patent Airway and No signs of Nausea or vomiting  Last Vitals:  Filed Vitals:   08/21/12 1245  BP: 166/80  Pulse: 64  Temp:   Resp: 19    Post-op Vital Signs: stable   Complications: No apparent anesthesia complications

## 2012-08-21 NOTE — H&P (Signed)
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  She did not reurn for follow-up until 2 weeks ago when Dr Clarene Duke referred her back to our office.  CT scan shows bilateral non obstructing renal calculi, right renal cysts and a 1.2 cm filling defect in the left lateral wall of the bladder. She needs cystoscopy.  It shows a small 2 cm papillary tumor on at the left base.  The reimplanted left ureteral orifice appears normal.   Past Medical History Problems  1. History of  Anxiety (Symptom) 300.00 2. History of  Asthma 493.90 3. History of  Hypercholesterolemia 272.0 4. History of  Hypertension 401.9 5. History of  Macular Degeneration 362.50 6. History of  Pulmonary Embolism V12.55 7. History of  Skin Cancer V10.83  Surgical History Problems  1. History of  Appendectomy 2. History of  Cesarean Section 3. History of  Cystoscopy With Insertion Of Ureteral Stent Left 4. History of  Cystoscopy With Ureteroscopy For Biopsy Left 5. History of  Hysterectomy V45.77 6. History of  Ureteroneocystostomy  Current Meds 1. Caltrate 600+D TABS; Therapy: (Recorded:08May2012) to 2. Fish Oil CAPS; Therapy: (Recorded:08May2012) to 3. Losartan Potassium TABS; Therapy: (Recorded:30Jun2014) to  Allergies Medication  1. Penicillins 2. Sulfa Drugs  Family History Problems  1. Family history of  Death In The Family Father Died age 54-Drowning 2. Family history of  Death In The Family Mother Died age 66-Depression 3. Family history of  Family Health Status Number Of Children 1 daughter  Social History Problems  1. Former Smoker V15.82 Smoked 1/2 ppd for 50 yrs and quit April 2012 2. Marital History - Widowed 3. Occupation: Retired Denied  4. History of  Alcohol 5. History of  Caffeine Use  Review of Systems Genitourinary, constitutional, skin, eye, otolaryngeal, hematologic/lymphatic, cardiovascular, pulmonary, endocrine,  musculoskeletal, gastrointestinal, neurological and psychiatric system(s) were reviewed and pertinent findings if present are noted.    Physical Exam Constitutional: Well nourished and well developed . No acute distress.  ENT:. The ears and nose are normal in appearance.  Neck: The appearance of the neck is normal and no neck mass is present.  Pulmonary: No respiratory distress and normal respiratory rhythm and effort.  Cardiovascular: Heart rate and rhythm are normal . No peripheral edema.  Abdomen: The abdomen is soft and nontender. No masses are palpated. No CVA tenderness. No hernias are palpable. No hepatosplenomegaly noted.  Genitourinary:  Chaperone Present: .  Examination of the external genitalia shows normal female external genitalia and no lesions. The urethra is normal in appearance and not tender. There is no urethral mass. Vaginal exam demonstrates no abnormalities. The adnexa are palpably normal. The bladder is non tender and not distended. The anus is normal on inspection. The perineum is normal on inspection.  Lymphatics: The femoral and inguinal nodes are not enlarged or tender.  Skin: Normal skin turgor, no visible rash and no visible skin lesions.  Neuro/Psych:. Mood and affect are appropriate.    Results/Data Urine [Data Includes: Last 1 Day]   15Jul2014  COLOR YELLOW   APPEARANCE CLEAR   SPECIFIC GRAVITY 1.015   pH 6.0   GLUCOSE NEG mg/dL  BILIRUBIN NEG   KETONE NEG mg/dL  BLOOD MOD   PROTEIN NEG mg/dL  UROBILINOGEN 0.2 mg/dL  NITRITE POS   LEUKOCYTE ESTERASE TRACE   SQUAMOUS EPITHELIAL/HPF RARE   WBC 3-6 WBC/hpf  RBC 0-2 RBC/hpf  BACTERIA RARE  CRYSTALS NONE SEEN   CASTS NONE SEEN    Procedure  Procedure: Cystoscopy Amended By: Su Grand; 08/10/2012 1:35 PMEST  Chaperone Present: Sima Matas Amended By: Su Grand; 08/10/2012 1:35 PMEST.  Indication: History of Urothelial Carcinoma. Amended By: Su Grand; 08/10/2012 1:35 PMEST.  Informed Consent:  Risks, benefits, and potential adverse events were discussed and informed consent was obtained Amended By: Su Grand; 08/10/2012 1:35 PMEST from the patient Amended By: Su Grand; 08/10/2012 1:35 PMEST . Specific risks including, but not limited to bleeding, infection, pain, allergic reaction etc. were explained. Amended By: Su Grand; 08/10/2012 1:35 PMEST.  Prep: The patient was prepped with betadine. Amended By: Su Grand; 08/10/2012 1:35 PMEST.  Anesthesia:. Local anesthesia was administered intraurethrally with 2% lidocaine jelly. Amended By: Su Grand; 08/10/2012 1:35 PMEST.  Antibiotic prophylaxis: Ciprofloxacin Amended By: Su Grand; 08/10/2012 1:35 PMEST.  Procedure Note:  Urethral meatus:. No abnormalities. Amended By: Su Grand; 08/10/2012 1:35 PMEST.  Anterior urethra: No abnormalities Amended By: Su Grand; 08/10/2012 1:35 PMEST.  Bladder: Visulization was clear Amended By: Su Grand; 08/10/2012 1:35 PMEST. The right ureteral orifice was in the normal anatomic position Amended By: Su Grand; 08/10/2012 1:35 PMEST.  The reimplanted left ureteral orifice is normal  Amended By: Su Grand; 08/10/2012 1:34 PMESTa solitary tumor was visualized in the bladder Amended By: Su Grand; 08/10/2012 1:36 PMEST A papillary Amended By: Su Grand; 08/10/2012 1:36 PMEST tumor was seen in the bladder. This tumor was located on the left side Amended By: Su Grand; 08/10/2012 1:36 PMEST, at the base Amended By: Su Grand; 08/10/2012 1:36 PMEST of the bladder. The patient tolerated the procedure well Amended By: Su Grand; 08/10/2012 1:40 PMEST  Complications: None. Amended By: Su Grand; 08/10/2012 1:40 PMEST.    Assessment Assessed  1. Bladder Cancer 188.9 2. Transitional Cell Carcinoma Of The Ureter 189.2  Plan Health Maintenance (V70.0)  1. UA With REFLEX  Done: 15Jul2014 01:14PM      She needs cystoscopy, TURBT, left retrograde pyelogram with ureteroscopy.  The procedure, risks,  benefits were explained to [name] and her granddaughter.  The risks include but are not limited to hemorrhage, infection, bladder or ureteral injury.  They understand and wish to proceed.

## 2012-08-21 NOTE — Op Note (Signed)
[name] is a 79 y.o.   08/21/2012  General  Preop diagnosis: Bladder tumor. Status post left distal ureterectomy for low-grade TCC of the ureter  Post op diagnosis: Same  Procedure done: Cystoscopy, left retrograde pyelogram, ureteroscopy, TUR bladder tumor  Surgeon: Wendie Simmer. [name]  Anesthesia: General  Indication: Patient is an 79 years old female who had a left  distal ureterectomy on 08/13/2010. She did not return for followup until about 3 weeks ago when Dr. Clarene Duke  referred her back to our office. CT scan showed bilateral nonobstructing renal calculi, right renal cyst and a 1.2 cm filling defect in the left lateral wall of the bladder. Cystoscopy revealed a 2 cm papillary tumor on the left base of the bladder. The reimplanted left ureteral orifice appeared normal. She is scheduled today for cystoscopy, left retrograde pyelogram, ureteroscopy and TUR bladder tumor  Procedure: Patient was identified by her wrist band and proper timeout was taken.  Under general anesthesia she was prepped and draped and placed in the dorsolithotomy position. A cystoscope was inserted in the bladder. There is a 2 cm papillary tumor on the left lateral wall of the bladder. There is also an other 3 cm tumor at the bladder neck on the left side. There is no stone in the bladder. The right ureteral orifice is in normal position. The left ureteral orifice is identified at the dome of the bladder on the left side.  Retrograde pyelogram:  A cone-tip catheter was passed through the cystoscope and the left reimplanted ureter. The ureter appears normal. There is no filling defect in the ureter. I could not fill the renal pelvis and collecting system. I removed the the cone-tip catheter and passed a glide wire through the left ureter. A number of 6 French open-ended catheter was passed over the Glidewire in the proximal ureter. Contrast was injected through the open-ended catheter. The renal pelvis and calyces  appear normal. A sensor wire was then passed through the open-ended catheter and the open-ended catheter was removed. The sensor wire was left in the ureter for easier access to the ureter.  The cystoscope was removed. A semirigid ureteroscope was then passed in the bladder and through the left ureter. There is no evidence of tumor in the ureter. The ureteral mucosa appears normal. The ureteroscope was then removed and the sensor wire also taken out.  A resectoscope was then inserted in the bladder. Resection of the tumor at the base of the bladder was done with the cold loop. The base of the bladder tumor was also resected for staging purposes. A 2 cm margin of resection was also fulgurated. The tumor at the bladder neck was also resected. Hemostasis was secured with electrocautery. There was no evidence of bleeding at the end of the procedure. There was no evidence of  remaining tumor in the bladder. The resectoscope was then removed. A #16 French Foley catheter was then inserted in the bladder.  The patient tolerated the procedure well and left the OR in satisfactory condition to postanesthesia care unit  EBL: Minimal

## 2012-08-22 ENCOUNTER — Encounter (HOSPITAL_BASED_OUTPATIENT_CLINIC_OR_DEPARTMENT_OTHER): Payer: Self-pay | Admitting: Urology

## 2013-10-22 ENCOUNTER — Institutional Professional Consult (permissible substitution): Payer: Medicare Other | Admitting: Cardiology

## 2014-01-22 ENCOUNTER — Emergency Department (HOSPITAL_COMMUNITY)
Admission: EM | Admit: 2014-01-22 | Discharge: 2014-01-22 | Disposition: A | Discharge status: Home / Self Care | Payer: Medicare Other | Attending: Emergency Medicine | Admitting: Emergency Medicine

## 2014-01-22 ENCOUNTER — Emergency Department (HOSPITAL_COMMUNITY): Payer: Medicare Other

## 2014-01-22 ENCOUNTER — Encounter (HOSPITAL_COMMUNITY): Payer: Self-pay | Admitting: Emergency Medicine

## 2014-01-22 DIAGNOSIS — I129 Hypertensive chronic kidney disease with stage 1 through stage 4 chronic kidney disease, or unspecified chronic kidney disease: Secondary | ICD-10-CM | POA: Diagnosis not present

## 2014-01-22 DIAGNOSIS — N183 Chronic kidney disease, stage 3 (moderate): Secondary | ICD-10-CM | POA: Diagnosis not present

## 2014-01-22 DIAGNOSIS — J449 Chronic obstructive pulmonary disease, unspecified: Secondary | ICD-10-CM | POA: Insufficient documentation

## 2014-01-22 DIAGNOSIS — Z8669 Personal history of other diseases of the nervous system and sense organs: Secondary | ICD-10-CM | POA: Insufficient documentation

## 2014-01-22 DIAGNOSIS — Z86711 Personal history of pulmonary embolism: Secondary | ICD-10-CM | POA: Diagnosis not present

## 2014-01-22 DIAGNOSIS — R319 Hematuria, unspecified: Secondary | ICD-10-CM | POA: Diagnosis not present

## 2014-01-22 DIAGNOSIS — E785 Hyperlipidemia, unspecified: Secondary | ICD-10-CM | POA: Diagnosis not present

## 2014-01-22 DIAGNOSIS — Z880 Allergy status to penicillin: Secondary | ICD-10-CM | POA: Diagnosis not present

## 2014-01-22 DIAGNOSIS — Z87448 Personal history of other diseases of urinary system: Secondary | ICD-10-CM | POA: Diagnosis not present

## 2014-01-22 DIAGNOSIS — Z720 Tobacco use: Secondary | ICD-10-CM | POA: Insufficient documentation

## 2014-01-22 DIAGNOSIS — Z8554 Personal history of malignant neoplasm of ureter: Secondary | ICD-10-CM | POA: Diagnosis not present

## 2014-01-22 DIAGNOSIS — K219 Gastro-esophageal reflux disease without esophagitis: Secondary | ICD-10-CM | POA: Diagnosis not present

## 2014-01-22 DIAGNOSIS — R52 Pain, unspecified: Secondary | ICD-10-CM

## 2014-01-22 DIAGNOSIS — M545 Low back pain: Secondary | ICD-10-CM | POA: Diagnosis present

## 2014-01-22 DIAGNOSIS — Z79899 Other long term (current) drug therapy: Secondary | ICD-10-CM | POA: Diagnosis not present

## 2014-01-22 DIAGNOSIS — M5442 Lumbago with sciatica, left side: Secondary | ICD-10-CM

## 2014-01-22 LAB — URINALYSIS, ROUTINE W REFLEX MICROSCOPIC
Bilirubin Urine: NEGATIVE
Glucose, UA: NEGATIVE mg/dL
Ketones, ur: NEGATIVE mg/dL
Leukocytes, UA: NEGATIVE
NITRITE: NEGATIVE
Protein, ur: NEGATIVE mg/dL
SPECIFIC GRAVITY, URINE: 1.018 (ref 1.005–1.030)
UROBILINOGEN UA: 0.2 mg/dL (ref 0.0–1.0)
pH: 5.5 (ref 5.0–8.0)

## 2014-01-22 LAB — URINE MICROSCOPIC-ADD ON

## 2014-01-22 MED ORDER — HYDROCODONE-ACETAMINOPHEN 5-325 MG PO TABS
1.0000 | ORAL_TABLET | Freq: Once | ORAL | Status: AC
  Administered 2014-01-22: 1 via ORAL
  Filled 2014-01-22: qty 1

## 2014-01-22 MED ORDER — HYDROCODONE-ACETAMINOPHEN 5-325 MG PO TABS: 0.5000 | ORAL_TABLET | ORAL | Status: DC | PRN

## 2014-01-22 NOTE — ED Notes (Signed)
Bed: WA02 Expected date:  Expected time:  Means of arrival:  Comments: EMS, back pain (elderly)

## 2014-01-22 NOTE — ED Provider Notes (Signed)
CSN: [id number]     Arrival date & time 01/22/14  0820 History   First MD Initiated Contact with Patient 01/22/14 0831     Chief Complaint  Patient presents with  . Back Pain     (Consider location/radiation/quality/duration/timing/severity/associated sxs/prior Treatment) HPI  80 year old female presents with left-sided low back/buttock pain for the past 1 week. She was lifting a jug of water to put in the fridge while she was putting it in the fridge she felt a sudden onset low back pain. The pain runs down the back of her leg to her knee. No weakness or numbness. She is able to ambulate with her walker, though this is painful. Has been taking 2 doses of a muscle relaxer prescribed by her PCP with no real relief. Tylenol does give her moderate relief. No fevers or chills. No urinary symptoms. No abdominal pain. No saddle anesthesia or incontinence.  Past Medical History  Diagnosis Date  . Hypertension   . COPD (chronic obstructive pulmonary disease)   . Macular degeneration   . History of pulmonary embolism     POST OP SURG 1948  . History of malignant neoplasm of ureter     S/P LEFT URETERECTOMY W/ REIMPLANTATION 2012 --  TCC  . Diverticulosis of colon   . Hyperlipidemia   . Environmental allergies   . Asymptomatic stenosis of right carotid artery     MILD RIGHT ICA  40-50% PER DR [place] NOTE  . MGUS (monoclonal gammopathy of unknown significance)     HEMATOLOGIST-- DR Alen Blew  . Aortic stenosis, mild   . CKD (chronic kidney disease), stage III   . Bladder tumor   . GERD (gastroesophageal reflux disease)     NO MEDS   Past Surgical History  Procedure Laterality Date  . Cesarean section  [place]  . Cysto/ left retrograde pyelogram/ bx left ureteral tumor/ stent placement  06-15-2010  . Left distal ureterectomy/  left ureteral reimplantation  08-13-2010    TRANSITIONAL CELL CARCINOMA  . Appendectomy  1948  . Benign breast bx    . Cardiovascular stress test  12-04-2007   DR Martinique    NORMAL NUCLER STUDY/ NORMAL LVF  . Transthoracic echocardiogram  02-22-2011    MILD LVH/ EF 52-84%/ GRADE I DIASTOLIC DYSFUNCTION/ MILD AORTIC STENOSIS/ MILD MR  . Abdominal hysterectomy  1979    W/ BILATERAL SALPINGOOPHECTOMY  . Transurethral resection of bladder tumor N/A 08/21/2012    Procedure: TRANSURETHRAL RESECTION OF BLADDER TUMOR (TURBT);  Surgeon: Hanley Ben, MD;  Location: [facility];  Service: Urology;  Laterality: N/A;  . Cystoscopy/retrograde/ureteroscopy Left 08/21/2012    Procedure: CYSTOSCOPY/RETROGRADE/URETEROSCOPY;  Surgeon: Hanley Ben, MD;  Location: [facility];  Service: Urology;  Laterality: Left;   No family history on file. History  Substance Use Topics  . Smoking status: Current Every Day Smoker -- 0.10 packs/day for 50 years    Types: Cigarettes  . Smokeless tobacco: Never Used     Comment: SMOKES 1 CIGERETTE PER DAY  . Alcohol Use: No   OB History    No data available     Review of Systems  Constitutional: Negative for fever.  Gastrointestinal: Negative for nausea, vomiting, abdominal pain and diarrhea.  Genitourinary: Negative for dysuria and hematuria.  Musculoskeletal: Positive for back pain.  Neurological: Negative for weakness and numbness.  All other systems reviewed and are negative.     Allergies  Penicillins; Sulfa antibiotics; Bactrim; and Pravastatin  Home Medications   Prior to Admission medications   Medication Sig Start Date End Date Taking? Authorizing Provider  ALBUTEROL IN Inhale into the lungs as needed.    Historical Provider, MD  budesonide-formoterol (SYMBICORT) 160-4.5 MCG/ACT inhaler Inhale 2 puffs into the lungs 2 (two) times daily.      Historical Provider, MD  Calcium Carbonate-Vitamin D (CALCIUM + D PO) Take by mouth daily.      Historical Provider, MD  fish oil-omega-3 fatty acids 1000 MG capsule Take 2 g by mouth daily.     Historical Provider, MD  losartan  (COZAAR) 25 MG tablet Take 25 mg by mouth every morning.     Historical Provider, MD  multivitamin-lutein [facility]) CAPS Take 1 capsule by mouth daily.      Historical Provider, MD   BP 164/67 mmHg  Pulse 64  Temp(Src) 98.2 F (36.8 C) (Oral)  Resp 20  SpO2 95% Physical Exam  Constitutional: She is oriented to person, place, and time. She appears well-developed and well-nourished.  Thin female in pain  HENT:  Head: Normocephalic and atraumatic.  Right Ear: External ear normal.  Left Ear: External ear normal.  Nose: Nose normal.  Eyes: Right eye exhibits no discharge. Left eye exhibits no discharge.  Cardiovascular: Normal rate, regular rhythm and normal heart sounds.   Pulmonary/Chest: Effort normal and breath sounds normal.  Abdominal: Soft. She exhibits no distension. There is no tenderness.  Musculoskeletal:       Cervical back: She exhibits no bony tenderness.       Thoracic back: She exhibits no bony tenderness.       Lumbar back: She exhibits no bony tenderness.       Back:  Neurological: She is alert and oriented to person, place, and time. She displays no Babinski's sign on the right side. She displays no Babinski's sign on the left side.  Reflex Scores:      Patellar reflexes are 2+ on the right side and 2+ on the left side.      Achilles reflexes are 2+ on the right side and 2+ on the left side. Normal strength in bilateral lower extremities. Normal sensation  Skin: Skin is warm and dry.  Nursing note and vitals reviewed.   ED Course  Procedures (including critical care time) Labs Review Labs Reviewed  URINALYSIS, ROUTINE W REFLEX MICROSCOPIC - Abnormal; Notable for the following:    Hgb urine dipstick MODERATE (*)    All other components within normal limits  URINE CULTURE  URINE MICROSCOPIC-ADD ON    Imaging Review Dg Pelvis 1-2 Views  01/22/2014   CLINICAL DATA:  Pain and left buttocks for 1 week after moving heavy object  EXAM: PELVIS - 1-2 VIEW   COMPARISON:  None.  FINDINGS: There is no demonstrable fracture or dislocation. There is symmetric mild narrowing of both hip joints. There is arthropathy in the lower lumbar spine with lower lumbar levoscoliosis. There are clips in the left pelvis. Bones appear somewhat osteoporotic.  IMPRESSION: Multifocal areas of osteoarthritic change. Osteoporosis. No fracture or dislocation.   Electronically Signed   By: Lowella Grip M.D.   On: 01/22/2014 09:13     EKG Interpretation None      MDM   Final diagnoses:  Left-sided low back pain with left-sided sciatica  Hematuria    Patient's pain appears to be musculoskeletal. Her pain improved with one dose of Norco. She was able to get up and ambulate without significant pain and without  requiring assistance. She lives with her grandson and thus I feel she is stable for outpatient treatment. Counseled on the use of narcotics. Patient does have hematuria in her urine, but she relates this is been ongoing for the past several years every time they check her urine. She has no urinary symptoms. I do not feel she is further workup of this at this time.    Ephraim Hamburger, MD 01/22/14 [phone]

## 2014-01-22 NOTE — ED Notes (Signed)
Per EMS, pt felt pull in her back one week ago while lifting a jug, saw PCP and was told she has a pulled muscle or pinched nerve, given muscle relaxer medication tizandine, pain unrelieved by medication, pt here for back pain radiating into buttocks bilaterally.

## 2014-01-22 NOTE — ED Notes (Signed)
Patient transported to X-ray 

## 2014-01-22 NOTE — Discharge Instructions (Signed)
Back Pain, Adult Low back pain is very common. About 1 in 5 people have back pain.The cause of low back pain is rarely dangerous. The pain often gets better over time.About half of people with a sudden onset of back pain feel better in just 2 weeks. About 8 in 10 people feel better by 6 weeks.  CAUSES Some common causes of back pain include:  Strain of the muscles or ligaments supporting the spine.  Wear and tear (degeneration) of the spinal discs.  Arthritis.  Direct injury to the back. DIAGNOSIS Most of the time, the direct cause of low back pain is not known.However, back pain can be treated effectively even when the exact cause of the pain is unknown.Answering your caregiver's questions about your overall health and symptoms is one of the most accurate ways to make sure the cause of your pain is not dangerous. If your caregiver needs more information, he or she may order lab work or imaging tests (X-rays or MRIs).However, even if imaging tests show changes in your back, this usually does not require surgery. HOME CARE INSTRUCTIONS For many people, back pain returns.Since low back pain is rarely dangerous, it is often a condition that people can learn to [facility] their own.   Remain active. It is stressful on the back to sit or stand in one place. Do not sit, drive, or stand in one place for more than 30 minutes at a time. Take short walks on level surfaces as soon as pain allows.Try to increase the length of time you walk each day.  Do not stay in bed.Resting more than 1 or 2 days can delay your recovery.  Do not avoid exercise or work.Your body is made to move.It is not dangerous to be active, even though your back may hurt.Your back will likely heal faster if you return to being active before your pain is gone.  Pay attention to your body when you bend and lift. Many people have less discomfortwhen lifting if they bend their knees, keep the load close to their bodies,and  avoid twisting. Often, the most comfortable positions are those that put less stress on your recovering back.  Find a comfortable position to sleep. Use a firm mattress and lie on your side with your knees slightly bent. If you lie on your back, put a pillow under your knees.  Only take over-the-counter or prescription medicines as directed by your caregiver. Over-the-counter medicines to reduce pain and inflammation are often the most helpful.Your caregiver may prescribe muscle relaxant drugs.These medicines help dull your pain so you can more quickly return to your normal activities and healthy exercise.  Put ice on the injured area.  Put ice in a plastic bag.  Place a towel between your skin and the bag.  Leave the ice on for 15-20 minutes, 03-04 times a day for the first 2 to 3 days. After that, ice and heat may be alternated to reduce pain and spasms.  Ask your caregiver about trying back exercises and gentle massage. This may be of some benefit.  Avoid feeling anxious or stressed.Stress increases muscle tension and can worsen back pain.It is important to recognize when you are anxious or stressed and learn ways to manage it.Exercise is a great option. SEEK MEDICAL CARE IF:  You have pain that is not relieved with rest or medicine.  You have pain that does not improve in 1 week.  You have new symptoms.  You are generally not feeling well. SEEK  IMMEDIATE MEDICAL CARE IF:   You have pain that radiates from your back into your legs.  You develop new bowel or bladder control problems.  You have unusual weakness or numbness in your arms or legs.  You develop nausea or vomiting.  You develop abdominal pain.  You feel faint. Document Released: 01/10/2005 Document Revised: 07/12/2011 Document Reviewed: 05/14/2013 [facility] Patient Information 2015 [place], [place]. This information is not intended to replace advice given to you by your health care provider. Make sure you  discuss any questions you have with your health care provider.    Radicular Pain Radicular pain in either the arm or leg is usually from a bulging or herniated disk in the spine. A piece of the herniated disk may press against the nerves as the nerves exit the spine. This causes pain which is felt at the tips of the nerves down the arm or leg. Other causes of radicular pain may include:  Fractures.  Heart disease.  Cancer.  An abnormal and usually degenerative state of the nervous system or nerves (neuropathy). Diagnosis may require CT or MRI scanning to determine the primary cause.  Nerves that start at the neck (nerve roots) may cause radicular pain in the outer shoulder and arm. It can spread down to the thumb and fingers. The symptoms vary depending on which nerve root has been affected. In most cases radicular pain improves with conservative treatment. Neck problems may require physical therapy, a neck collar, or cervical traction. Treatment may take many weeks, and surgery may be considered if the symptoms do not improve.  Conservative treatment is also recommended for sciatica. Sciatica causes pain to radiate from the lower back or buttock area down the leg into the foot. Often there is a history of back problems. Most patients with sciatica are better after 2 to 4 weeks of rest and other supportive care. Short term bed rest can reduce the disk pressure considerably. Sitting, however, is not a good position since this increases the pressure on the disk. You should avoid bending, lifting, and all other activities which make the problem worse. Traction can be used in severe cases. Surgery is usually reserved for patients who do not improve within the first months of treatment. Only take over-the-counter or prescription medicines for pain, discomfort, or fever as directed by your caregiver. Narcotics and muscle relaxants may help by relieving more severe pain and spasm and by providing mild  sedation. Cold or massage can give significant relief. Spinal manipulation is not recommended. It can increase the degree of disc protrusion. Epidural steroid injections are often effective treatment for radicular pain. These injections deliver medicine to the spinal nerve in the space between the protective covering of the spinal cord and back bones (vertebrae). Your caregiver can give you more information about steroid injections. These injections are most effective when given within two weeks of the onset of pain.  You should see your caregiver for follow up care as recommended. A program for neck and back injury rehabilitation with stretching and strengthening exercises is an important part of management.  SEEK IMMEDIATE MEDICAL CARE IF:  You develop increased pain, weakness, or numbness in your arm or leg.  You develop difficulty with bladder or bowel control.  You develop abdominal pain. Document Released: 02/18/2004 Document Revised: 04/04/2011 Document Reviewed: 05/05/2008 [facility] Patient Information 2015 [place], [place]. This information is not intended to replace advice given to you by your health care provider. Make sure you discuss any questions you  have with your health care provider.   Hematuria Hematuria is blood in your urine. It can be caused by a bladder infection, kidney infection, prostate infection, kidney stone, or cancer of your urinary tract. Infections can usually be treated with medicine, and a kidney stone usually will pass through your urine. If neither of these is the cause of your hematuria, further workup to find out the reason may be needed. It is very important that you tell your health care provider about any blood you see in your urine, even if the blood stops without treatment or happens without causing pain. Blood in your urine that happens and then stops and then happens again can be a symptom of a very serious condition. Also, pain is not a symptom in the  initial stages of many urinary cancers. HOME CARE INSTRUCTIONS   Drink lots of fluid, 3-4 quarts a day. If you have been diagnosed with an infection, cranberry juice is especially recommended, in addition to large amounts of water.  Avoid caffeine, tea, and carbonated beverages because they tend to irritate the bladder.  Avoid alcohol because it may irritate the prostate.  Take all medicines as directed by your health care provider.  If you were prescribed an antibiotic medicine, finish it all even if you start to feel better.  If you have been diagnosed with a kidney stone, follow your health care provider's instructions regarding straining your urine to catch the stone.  Empty your bladder often. Avoid holding urine for long periods of time.  After a bowel movement, women should cleanse front to back. Use each tissue only once.  Empty your bladder before and after sexual intercourse if you are a female. SEEK MEDICAL CARE IF:  You develop back pain.  You have a fever.  You have a feeling of sickness in your stomach (nausea) or vomiting.  Your symptoms are not better in 3 days. Return sooner if you are getting worse. SEEK IMMEDIATE MEDICAL CARE IF:   You develop severe vomiting and are unable to keep the medicine down.  You develop severe back or abdominal pain despite taking your medicines.  You begin passing a large amount of blood or clots in your urine.  You feel extremely weak or faint, or you pass out. MAKE SURE YOU:   Understand these instructions.  Will watch your condition.  Will get help right away if you are not doing well or get worse. Document Released: 01/10/2005 Document Revised: 05/27/2013 Document Reviewed: 09/10/2012 [facility] Patient Information 2015 [place], [place]. This information is not intended to replace advice given to you by your health care provider. Make sure you discuss any questions you have with your health care provider.

## 2014-01-23 LAB — URINE CULTURE: Colony Count: 10000

## 2015-01-09 ENCOUNTER — Emergency Department (HOSPITAL_COMMUNITY)
Admission: EM | Admit: 2015-01-09 | Discharge: 2015-01-10 | Disposition: A | Discharge status: Home / Self Care | Payer: Medicare Other | Attending: Emergency Medicine | Admitting: Emergency Medicine

## 2015-01-09 ENCOUNTER — Encounter (HOSPITAL_COMMUNITY): Payer: Self-pay | Admitting: Emergency Medicine

## 2015-01-09 DIAGNOSIS — Z79899 Other long term (current) drug therapy: Secondary | ICD-10-CM | POA: Insufficient documentation

## 2015-01-09 DIAGNOSIS — J449 Chronic obstructive pulmonary disease, unspecified: Secondary | ICD-10-CM | POA: Insufficient documentation

## 2015-01-09 DIAGNOSIS — Z8719 Personal history of other diseases of the digestive system: Secondary | ICD-10-CM | POA: Diagnosis not present

## 2015-01-09 DIAGNOSIS — I129 Hypertensive chronic kidney disease with stage 1 through stage 4 chronic kidney disease, or unspecified chronic kidney disease: Secondary | ICD-10-CM | POA: Diagnosis present

## 2015-01-09 DIAGNOSIS — Z880 Allergy status to penicillin: Secondary | ICD-10-CM | POA: Insufficient documentation

## 2015-01-09 DIAGNOSIS — Z8639 Personal history of other endocrine, nutritional and metabolic disease: Secondary | ICD-10-CM | POA: Diagnosis not present

## 2015-01-09 DIAGNOSIS — Z8669 Personal history of other diseases of the nervous system and sense organs: Secondary | ICD-10-CM | POA: Insufficient documentation

## 2015-01-09 DIAGNOSIS — I159 Secondary hypertension, unspecified: Secondary | ICD-10-CM

## 2015-01-09 DIAGNOSIS — Z86711 Personal history of pulmonary embolism: Secondary | ICD-10-CM | POA: Diagnosis not present

## 2015-01-09 DIAGNOSIS — Z8739 Personal history of other diseases of the musculoskeletal system and connective tissue: Secondary | ICD-10-CM | POA: Diagnosis not present

## 2015-01-09 DIAGNOSIS — N183 Chronic kidney disease, stage 3 (moderate): Secondary | ICD-10-CM | POA: Insufficient documentation

## 2015-01-09 DIAGNOSIS — Z9889 Other specified postprocedural states: Secondary | ICD-10-CM | POA: Diagnosis not present

## 2015-01-09 DIAGNOSIS — F17210 Nicotine dependence, cigarettes, uncomplicated: Secondary | ICD-10-CM | POA: Insufficient documentation

## 2015-01-09 DIAGNOSIS — Z8554 Personal history of malignant neoplasm of ureter: Secondary | ICD-10-CM | POA: Diagnosis not present

## 2015-01-09 LAB — BASIC METABOLIC PANEL
Anion gap: 10 (ref 5–15)
BUN: 26 mg/dL — ABNORMAL HIGH (ref 6–20)
CALCIUM: 9.7 mg/dL (ref 8.9–10.3)
CO2: 27 mmol/L (ref 22–32)
CREATININE: 1.2 mg/dL — AB (ref 0.44–1.00)
Chloride: 104 mmol/L (ref 101–111)
GFR, EST AFRICAN AMERICAN: 45 mL/min — AB (ref >60)
GFR, EST NON AFRICAN AMERICAN: 39 mL/min — AB (ref >60)
Glucose, Bld: 111 mg/dL — ABNORMAL HIGH (ref 65–99)
Potassium: 5.1 mmol/L (ref 3.5–5.1)
SODIUM: 141 mmol/L (ref 135–145)

## 2015-01-09 LAB — CBC WITH DIFFERENTIAL/PLATELET
BASOS ABS: 0 10*3/uL (ref 0.0–0.1)
BASOS PCT: 0 %
EOS ABS: 0.1 10*3/uL (ref 0.0–0.7)
Eosinophils Relative: 1 %
HCT: 43.5 % (ref 36.0–46.0)
Hemoglobin: 13.7 g/dL (ref 12.0–15.0)
Lymphocytes Relative: 21 %
Lymphs Abs: 2 10*3/uL (ref 0.7–4.0)
MCH: 31.1 pg (ref 26.0–34.0)
MCHC: 31.5 g/dL (ref 30.0–36.0)
MCV: 98.6 fL (ref 78.0–100.0)
MONO ABS: 0.9 10*3/uL (ref 0.1–1.0)
MONOS PCT: 9 %
NEUTROS PCT: 69 %
Neutro Abs: 6.4 10*3/uL (ref 1.7–7.7)
PLATELETS: 249 10*3/uL (ref 150–400)
RBC: 4.41 MIL/uL (ref 3.87–5.11)
RDW: 15.1 % (ref 11.5–15.5)
WBC: 9.4 10*3/uL (ref 4.0–10.5)

## 2015-01-09 MED ORDER — LOSARTAN POTASSIUM 50 MG PO TABS
50.0000 mg | ORAL_TABLET | Freq: Once | ORAL | Status: AC
Start: 2015-01-09 — End: 2015-01-09
  Administered 2015-01-09: 50 mg via ORAL
  Filled 2015-01-09: qty 1

## 2015-01-09 MED ORDER — LOSARTAN POTASSIUM 25 MG PO TABS
25.0000 mg | ORAL_TABLET | Freq: Once | ORAL | Status: DC
  Filled 2015-01-09: qty 1

## 2015-01-09 MED ORDER — LOSARTAN POTASSIUM 50 MG PO TABS: 50.0000 mg | ORAL_TABLET | Freq: Every day | ORAL | Status: AC

## 2015-01-09 NOTE — ED Notes (Signed)
Pt. seen by her urologist and at [facility] walk -in clinic today advised to go to ER due to elevated blood pressure ( Sys 200+) , denies any symptoms at triage , respirations unlabored / no pain , dizziness or nausea.

## 2015-01-09 NOTE — ED Provider Notes (Signed)
CSN: [id number]     Arrival date & time 01/09/15  1945 History  By signing my name below, I, Evelene Croon, attest that this documentation has been prepared under the direction and in the presence of Everlene Balls, MD . Electronically Signed: Evelene Croon, Scribe. 01/09/2015. 11:11 PM.    Chief Complaint  Patient presents with  . Hypertension    The history is provided by the patient and a relative (Grandson). No language interpreter was used.   HPI Comments:  [name] is a 81 y.o. female with a history of HTN, who presents to the Emergency Department complaining of elevated BP today. Per grandson, pt was seen at her urologist office for urinary symptoms. In office pt's BP was in the 123456 systolic and she was advised to follow up with PCP immediately. She was able to follow up with [facility] physicians who advised pt to be evaluated in the ED. No recent changes in BP meds. Pt denies CP, SOB, slurred speech, and weakness in her BUE and BLE. No alleviating factors noted.  Past Medical History  Diagnosis Date  . Hypertension   . COPD (chronic obstructive pulmonary disease) ([place])   . Macular degeneration   . History of pulmonary embolism     POST OP SURG 1948  . History of malignant neoplasm of ureter     S/P LEFT URETERECTOMY W/ REIMPLANTATION 2012 --  [facility]  . Diverticulosis of colon   . Hyperlipidemia   . Environmental allergies   . Asymptomatic stenosis of right carotid artery     MILD RIGHT ICA  40-50% PER DR [place] NOTE  . MGUS (monoclonal gammopathy of unknown significance)     HEMATOLOGIST-- DR Alen Blew  . Aortic stenosis, mild   . CKD (chronic kidney disease), stage III   . Bladder tumor   . GERD (gastroesophageal reflux disease)     NO MEDS   Past Surgical History  Procedure Laterality Date  . Cesarean section  [place]  . Cysto/ left retrograde pyelogram/ bx left ureteral tumor/ stent placement  06-15-2010  . Left distal ureterectomy/  left ureteral reimplantation   08-13-2010    TRANSITIONAL CELL CARCINOMA  . Appendectomy  1948  . Benign breast bx    . Cardiovascular stress test  12-04-2007  DR [place]ique    NORMAL NUCLER STUDY/ NORMAL LVF  . Transthoracic echocardiogram  02-22-2011    MILD LVH/ EF 123456 GRADE I DIASTOLIC DYSFUNCTION/ MILD AORTIC STENOSIS/ MILD MR  . Abdominal hysterectomy  1979    W/ BILATERAL SALPINGOOPHECTOMY  . Transurethral resection of bladder tumor N/A 08/21/2012    Procedure: TRANSURETHRAL RESECTION OF BLADDER TUMOR (TURBT);  Surgeon: Hanley Ben, MD;  Location: [facility];  Service: Urology;  Laterality: N/A;  . Cystoscopy/retrograde/ureteroscopy Left 08/21/2012    Procedure: CYSTOSCOPY/RETROGRADE/URETEROSCOPY;  Surgeon: Hanley Ben, MD;  Location: [facility];  Service: Urology;  Laterality: Left;   No family history on file. Social History  Substance Use Topics  . Smoking status: Current Every Day Smoker -- 0.10 packs/day for 50 years    Types: Cigarettes  . Smokeless tobacco: Never Used     Comment: SMOKES 1 CIGERETTE PER DAY  . Alcohol Use: No   OB History    No data available     Review of Systems  10 systems reviewed and all are negative for acute change except as noted in the HPI.   Allergies  Penicillins; Sulfa antibiotics; Bactrim; and Pravastatin  Home Medications   Prior to Admission medications   Medication Sig Start Date End Date Taking? Authorizing Provider  albuterol (PROVENTIL HFA;VENTOLIN HFA) 108 (90 BASE) MCG/ACT inhaler Inhale 2 puffs into the lungs every 6 (six) hours as needed for wheezing or shortness of breath (wheezing & shortness of breath).    Historical Provider, MD  Calcium Carbonate-Vitamin D (CALCIUM + D PO) Take 1 tablet by mouth daily.     Historical Provider, MD  HYDROcodone-acetaminophen (NORCO) 5-325 MG per tablet Take 0.5-1 tablets by mouth every 4 (four) hours as needed for severe pain. 01/22/14   Sherwood Gambler, MD  losartan (COZAAR)  25 MG tablet Take 25 mg by mouth every morning.     Historical Provider, MD  multivitamin-lutein [facility]) CAPS Take 1 capsule by mouth daily.      Historical Provider, MD   There were no vitals taken for this visit. Physical Exam  Constitutional: She is oriented to person, place, and time. She appears well-developed and well-nourished. No distress.  HENT:  Head: Normocephalic and atraumatic.  Nose: Nose normal.  Mouth/Throat: Oropharynx is clear and moist. No oropharyngeal exudate.  Eyes: Conjunctivae and EOM are normal. Pupils are equal, round, and reactive to light. No scleral icterus.  Neck: Normal range of motion. Neck supple. No JVD present. No tracheal deviation present. No thyromegaly present.  Cardiovascular: Normal rate, regular rhythm and normal heart sounds.  Exam reveals no gallop and no friction rub.   No murmur heard. Pulmonary/Chest: Effort normal and breath sounds normal. No respiratory distress. She has no wheezes. She exhibits no tenderness.  Abdominal: Soft. Bowel sounds are normal. She exhibits no distension and no mass. There is no tenderness. There is no rebound and no guarding.  Musculoskeletal: Normal range of motion. She exhibits no edema or tenderness.  Lymphadenopathy:    She has no cervical adenopathy.  Neurological: She is alert and oriented to person, place, and time. No cranial nerve deficit. She exhibits normal muscle tone.  Normal strength and sensation to all extremities Normal cerebellar testing   Skin: Skin is warm and dry. No rash noted. No erythema. No pallor.  Nursing note and vitals reviewed.   ED Course  Procedures   DIAGNOSTIC STUDIES:  Oxygen Saturation is 100% on room air, normal by my interpretation.    COORDINATION OF CARE:  11:07 PM Discussed treatment plan with pt and grandson at bedside and pt agreed to plan.  Labs Review Labs Reviewed  BASIC METABOLIC PANEL - Abnormal; Notable for the following:    Glucose, Bld 111 (*)     BUN 26 (*)    Creatinine, Ser 1.20 (*)    GFR calc non Af Amer 39 (*)    GFR calc Af Amer 45 (*)    All other components within normal limits  CBC WITH DIFFERENTIAL/PLATELET    Imaging Review No results found. I have personally reviewed and evaluated these images and lab results as part of my medical decision-making.   EKG Interpretation   Date/Time:  Friday January 09 2015 23:32:45 EST Ventricular Rate:  63 PR Interval:  128 QRS Duration: 89 QT Interval:  435 QTC Calculation: 445 R Axis:   35 Text Interpretation:  Sinus rhythm Left ventricular hypertrophy Anterior Q  waves, possibly due to LVH Abnrm T, probable ischemia, anterolateral lds  Sinus rhythm Left ventricular hypertrophy T wave abnormality No  significant change since last tracing Abnormal ekg Confirmed by Carmin Muskrat  MD [phone] on 01/09/2015 11:38:21 PM  MDM   Final diagnoses:  Secondary hypertension, unspecified    Patient presents to the ED for evaluation of HTN.  She denies any symptoms of end organ damage.  She has not taken her HTN meds yet tonight but her BP here is 170/110 an I doubt losartan 25mg  will get this down to a normal level.  Will change her medications to losartan 50mg  and have patient obtain close follow up with PCP.  She appears well and in NAD.  VS remain within her normal limits, BP is now 167/88 and patient is safe for DC.  I personally performed the services described in this documentation, which was scribed in my presence. The recorded information has been reviewed and is accurate.      Everlene Balls, MD 01/10/15 [phone]

## 2015-01-09 NOTE — Discharge Instructions (Signed)
Hypertension [name], take losartan 50mg  daily for your high blood pressure and see your primary care doctor within 3 days for close follow up and repeat blood pressure check.  If any symptoms worsen, come back to the ED immediately.  Thank you. Hypertension is another name for high blood pressure. High blood pressure forces your heart to work harder to pump blood. A blood pressure reading has two numbers, which includes a higher number over a lower number (example: 110/72). HOME CARE   Have your blood pressure rechecked by your doctor.  Only take medicine as told by your doctor. Follow the directions carefully. The medicine does not work as well if you skip doses. Skipping doses also puts you at risk for problems.  Do not smoke.  Monitor your blood pressure at home as told by your doctor. GET HELP IF:  You think you are having a reaction to the medicine you are taking.  You have repeat headaches or feel dizzy.  You have puffiness (swelling) in your ankles.  You have trouble with your vision. GET HELP RIGHT AWAY IF:   You get a very bad headache and are confused.  You feel weak, numb, or faint.  You get chest or belly (abdominal) pain.  You throw up (vomit).  You cannot breathe very well. MAKE SURE YOU:   Understand these instructions.  Will watch your condition.  Will get help right away if you are not doing well or get worse.   This information is not intended to replace advice given to you by your health care provider. Make sure you discuss any questions you have with your health care provider.   Document Released: 06/29/2007 Document Revised: 01/15/2013 Document Reviewed: 11/02/2012 Elsevier Interactive Patient Education [employer].

## 2015-01-14 ENCOUNTER — Emergency Department (HOSPITAL_COMMUNITY): Payer: Medicare Other

## 2015-01-14 ENCOUNTER — Emergency Department (HOSPITAL_COMMUNITY)
Admission: EM | Admit: 2015-01-14 | Discharge: 2015-01-14 | Disposition: A | Discharge status: Home / Self Care | Payer: Medicare Other | Attending: Emergency Medicine | Admitting: Emergency Medicine

## 2015-01-14 ENCOUNTER — Encounter (HOSPITAL_COMMUNITY): Payer: Self-pay | Admitting: Emergency Medicine

## 2015-01-14 DIAGNOSIS — Z86018 Personal history of other benign neoplasm: Secondary | ICD-10-CM | POA: Diagnosis not present

## 2015-01-14 DIAGNOSIS — Z8551 Personal history of malignant neoplasm of bladder: Secondary | ICD-10-CM | POA: Insufficient documentation

## 2015-01-14 DIAGNOSIS — F17210 Nicotine dependence, cigarettes, uncomplicated: Secondary | ICD-10-CM | POA: Insufficient documentation

## 2015-01-14 DIAGNOSIS — Z8554 Personal history of malignant neoplasm of ureter: Secondary | ICD-10-CM | POA: Insufficient documentation

## 2015-01-14 DIAGNOSIS — N183 Chronic kidney disease, stage 3 (moderate): Secondary | ICD-10-CM | POA: Insufficient documentation

## 2015-01-14 DIAGNOSIS — Z8639 Personal history of other endocrine, nutritional and metabolic disease: Secondary | ICD-10-CM | POA: Diagnosis not present

## 2015-01-14 DIAGNOSIS — Z8669 Personal history of other diseases of the nervous system and sense organs: Secondary | ICD-10-CM | POA: Diagnosis not present

## 2015-01-14 DIAGNOSIS — W01198A Fall on same level from slipping, tripping and stumbling with subsequent striking against other object, initial encounter: Secondary | ICD-10-CM | POA: Insufficient documentation

## 2015-01-14 DIAGNOSIS — Y998 Other external cause status: Secondary | ICD-10-CM | POA: Insufficient documentation

## 2015-01-14 DIAGNOSIS — R0781 Pleurodynia: Secondary | ICD-10-CM

## 2015-01-14 DIAGNOSIS — Z8719 Personal history of other diseases of the digestive system: Secondary | ICD-10-CM | POA: Insufficient documentation

## 2015-01-14 DIAGNOSIS — Z79899 Other long term (current) drug therapy: Secondary | ICD-10-CM | POA: Insufficient documentation

## 2015-01-14 DIAGNOSIS — S2232XA Fracture of one rib, left side, initial encounter for closed fracture: Secondary | ICD-10-CM | POA: Diagnosis not present

## 2015-01-14 DIAGNOSIS — Z86711 Personal history of pulmonary embolism: Secondary | ICD-10-CM | POA: Insufficient documentation

## 2015-01-14 DIAGNOSIS — Z880 Allergy status to penicillin: Secondary | ICD-10-CM | POA: Insufficient documentation

## 2015-01-14 DIAGNOSIS — Y9389 Activity, other specified: Secondary | ICD-10-CM | POA: Insufficient documentation

## 2015-01-14 DIAGNOSIS — J449 Chronic obstructive pulmonary disease, unspecified: Secondary | ICD-10-CM | POA: Insufficient documentation

## 2015-01-14 DIAGNOSIS — I129 Hypertensive chronic kidney disease with stage 1 through stage 4 chronic kidney disease, or unspecified chronic kidney disease: Secondary | ICD-10-CM | POA: Diagnosis not present

## 2015-01-14 DIAGNOSIS — Z7951 Long term (current) use of inhaled steroids: Secondary | ICD-10-CM | POA: Diagnosis not present

## 2015-01-14 DIAGNOSIS — Y92009 Unspecified place in unspecified non-institutional (private) residence as the place of occurrence of the external cause: Secondary | ICD-10-CM | POA: Diagnosis not present

## 2015-01-14 DIAGNOSIS — S299XXA Unspecified injury of thorax, initial encounter: Secondary | ICD-10-CM | POA: Diagnosis present

## 2015-01-14 LAB — CBC WITH DIFFERENTIAL/PLATELET
Basophils Absolute: 0 10*3/uL (ref 0.0–0.1)
Basophils Relative: 0 %
EOS ABS: 0.1 10*3/uL (ref 0.0–0.7)
EOS PCT: 1 %
HCT: 40.9 % (ref 36.0–46.0)
HEMOGLOBIN: 12.5 g/dL (ref 12.0–15.0)
LYMPHS ABS: 1.1 10*3/uL (ref 0.7–4.0)
Lymphocytes Relative: 13 %
MCH: 30.5 pg (ref 26.0–34.0)
MCHC: 30.6 g/dL (ref 30.0–36.0)
MCV: 99.8 fL (ref 78.0–100.0)
MONO ABS: 0.8 10*3/uL (ref 0.1–1.0)
MONOS PCT: 10 %
Neutro Abs: 6.5 10*3/uL (ref 1.7–7.7)
Neutrophils Relative %: 76 %
PLATELETS: 204 10*3/uL (ref 150–400)
RBC: 4.1 MIL/uL (ref 3.87–5.11)
RDW: 14.8 % (ref 11.5–15.5)
WBC: 8.5 10*3/uL (ref 4.0–10.5)

## 2015-01-14 LAB — URINALYSIS, ROUTINE W REFLEX MICROSCOPIC
Bilirubin Urine: NEGATIVE
GLUCOSE, UA: NEGATIVE mg/dL
Hgb urine dipstick: NEGATIVE
KETONES UR: NEGATIVE mg/dL
LEUKOCYTES UA: NEGATIVE
NITRITE: NEGATIVE
PH: 6 (ref 5.0–8.0)
Protein, ur: NEGATIVE mg/dL
SPECIFIC GRAVITY, URINE: 1.017 (ref 1.005–1.030)

## 2015-01-14 LAB — BASIC METABOLIC PANEL
ANION GAP: 9 (ref 5–15)
BUN: 34 mg/dL — ABNORMAL HIGH (ref 6–20)
CHLORIDE: 107 mmol/L (ref 101–111)
CO2: 27 mmol/L (ref 22–32)
Calcium: 9.2 mg/dL (ref 8.9–10.3)
Creatinine, Ser: 1.1 mg/dL — ABNORMAL HIGH (ref 0.44–1.00)
GFR calc non Af Amer: 44 mL/min — ABNORMAL LOW (ref >60)
GFR, EST AFRICAN AMERICAN: 50 mL/min — AB (ref >60)
GLUCOSE: 101 mg/dL — AB (ref 65–99)
Potassium: 4.9 mmol/L (ref 3.5–5.1)
Sodium: 143 mmol/L (ref 135–145)

## 2015-01-14 MED ORDER — MORPHINE SULFATE (PF) 4 MG/ML IV SOLN
4.0000 mg | Freq: Once | INTRAVENOUS | Status: AC
  Administered 2015-01-14: 4 mg via INTRAVENOUS
  Filled 2015-01-14: qty 1

## 2015-01-14 MED ORDER — HYDROCODONE-ACETAMINOPHEN 5-325 MG PO TABS: 1.0000 | ORAL_TABLET | ORAL | Status: AC | PRN
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
## 2015-01-14 NOTE — Discharge Instructions (Signed)

## 2015-01-14 NOTE — ED Provider Notes (Addendum)
CSN: [id number]     Arrival date & time 01/14/15  1200 History   First MD Initiated Contact with Patient 01/14/15 1234     Chief Complaint  Patient presents with  . Fall    HPI Pt was walking this morning about 1.5 hours ago and she slipped and fell.  Pt denies feeling lightheaded.  No LOC.  She was unable to get up off the ground.  She called 911.  Pt was seen by urology recently for routine surveillance of a ureter malignancy.  Her urine was checked and they started treatment for a uti.    That was 3 days ago..  She hit the table on the corner with her back.  She is having pain in that area.  No head injury.  No vomiting.   No diarrhea.  Pt denies any assault.  Past Medical History  Diagnosis Date  . Hypertension   . COPD (chronic obstructive pulmonary disease) ([place])   . Macular degeneration   . History of pulmonary embolism     POST OP SURG 1948  . History of malignant neoplasm of ureter     S/P LEFT URETERECTOMY W/ REIMPLANTATION 2012 --  [facility]  . Diverticulosis of colon   . Hyperlipidemia   . Environmental allergies   . Asymptomatic stenosis of right carotid artery     MILD RIGHT ICA  40-50% PER DR Martinique NOTE  . MGUS (monoclonal gammopathy of unknown significance)     HEMATOLOGIST-- DR Alen Blew  . Aortic stenosis, mild   . CKD (chronic kidney disease), stage III   . Bladder tumor   . GERD (gastroesophageal reflux disease)     NO MEDS   Past Surgical History  Procedure Laterality Date  . Cesarean section  [place]  . Cysto/ left retrograde pyelogram/ bx left ureteral tumor/ stent placement  06-15-2010  . Left distal ureterectomy/  left ureteral reimplantation  08-13-2010    TRANSITIONAL CELL CARCINOMA  . Appendectomy  1948  . Benign breast bx    . Cardiovascular stress test  12-04-2007  DR Martinique    NORMAL NUCLER STUDY/ NORMAL LVF  . Transthoracic echocardiogram  02-22-2011    MILD LVH/ EF 123456 GRADE I DIASTOLIC DYSFUNCTION/ MILD AORTIC STENOSIS/ MILD MR  .  Abdominal hysterectomy  1979    W/ BILATERAL SALPINGOOPHECTOMY  . Transurethral resection of bladder tumor N/A 08/21/2012    Procedure: TRANSURETHRAL RESECTION OF BLADDER TUMOR (TURBT);  Surgeon: Hanley Ben, MD;  [facility]: [facility];  Service: Urology;  Laterality: N/A;  . Cystoscopy/retrograde/ureteroscopy Left 08/21/2012    Procedure: CYSTOSCOPY/RETROGRADE/URETEROSCOPY;  Surgeon: Hanley Ben, MD;  [facility];  Service: Urology;  Laterality: Left;   History reviewed. No pertinent family history. Social History  Substance Use Topics  . Smoking status: Current Every Day Smoker -- 0.10 packs/day for 50 years    Types: Cigarettes  . Smokeless tobacco: Never Used     Comment: SMOKES 1 CIGERETTE PER DAY  . Alcohol Use: No   OB History    No data available     Review of Systems  All other systems reviewed and are negative.     Allergies  Penicillins; Sulfa antibiotics; Bactrim; and Pravastatin  Home Medications   Prior to Admission medications   Medication Sig Start Date End Date Taking? Authorizing Provider  albuterol (PROVENTIL HFA;VENTOLIN HFA) 108 (90 BASE) MCG/ACT inhaler Inhale 2 puffs into the lungs every 6 (six) hours as needed  for wheezing or shortness of breath (wheezing & shortness of breath).   Yes Historical Provider, MD  ALPRAZolam Duanne Moron) 0.5 MG tablet Take 0.5 mg by mouth at bedtime as needed for sleep.    Yes Historical Provider, MD  guaiFENesin (MUCINEX) 600 MG 12 hr tablet Take 600 mg by mouth 2 (two) times daily as needed for cough.   Yes Historical Provider, MD  losartan (COZAAR) 50 MG tablet Take 1 tablet (50 mg total) by mouth daily. 01/09/15  Yes Everlene Balls, MD  mometasone-formoterol (DULERA) 200-5 MCG/ACT AERO Inhale 2 puffs into the lungs 2 (two) times daily.   Yes Historical Provider, MD  Multiple Vitamins-Minerals (PRESERVISION AREDS PO) Take 1 capsule by mouth 2 (two) times daily.   Yes Historical  Provider, MD  nitrofurantoin, macrocrystal-monohydrate, (MACROBID) 100 MG capsule Take 100 mg by mouth 2 (two) times daily.   Yes Historical Provider, MD  tobramycin (TOBREX) 0.3 % ophthalmic solution Place 1 drop into the right eye 4 (four) times daily as needed. To start 1 day before eyecare appointment and end 1 day after appointment 01/09/15  Yes Historical Provider, MD   BP 159/85 mmHg  Pulse 62  Temp(Src) 97.8 F (36.6 C) (Oral)  Resp 18  SpO2 96% Physical Exam  Constitutional: No distress.  Frail elderly  HENT:  Head: Normocephalic and atraumatic.  Right Ear: External ear normal.  Left Ear: External ear normal.  Eyes: Conjunctivae are normal. Right eye exhibits no discharge. Left eye exhibits no discharge. No scleral icterus.  Neck: Neck supple. No tracheal deviation present.  Cardiovascular: Normal rate, regular rhythm and intact distal pulses.   Pulmonary/Chest: Effort normal and breath sounds normal. No stridor. No respiratory distress. She has no wheezes. She has no rales.    Linear contusion posterior thoracic region left side, ttp over that area, no crepitus  Abdominal: Soft. Bowel sounds are normal. She exhibits no distension. There is no tenderness. There is no rebound and no guarding.  Musculoskeletal: She exhibits no edema or tenderness.  Neurological: She is alert. She has normal strength. She displays tremor. No cranial nerve deficit (no facial droop, extraocular movements intact, no slurred speech) or sensory deficit. She exhibits normal muscle tone. She displays no seizure activity. Coordination normal.  Skin: Skin is warm and dry. No rash noted.  Psychiatric: She has a normal mood and affect.  Nursing note and vitals reviewed.   ED Course  Procedures (including critical care time) Labs Review Labs Reviewed  BASIC METABOLIC PANEL - Abnormal; Notable for the following:    Glucose, Bld 101 (*)    BUN 34 (*)    Creatinine, Ser 1.10 (*)    GFR calc non Af Amer  44 (*)    GFR calc Af Amer 50 (*)    All other components within normal limits  CBC WITH DIFFERENTIAL/PLATELET  URINALYSIS, ROUTINE W REFLEX MICROSCOPIC (NOT AT [facility])    Imaging Review Dg Ribs Unilateral W/chest Left  01/14/2015  CLINICAL DATA:  Left lower rib pain, fall this morning on picnic table EXAM: LEFT RIBS AND CHEST - 3+ VIEW COMPARISON:  07/24/2014 FINDINGS: Hyperinflation again noted. No acute infiltrate or pulmonary edema. There is no pneumothorax. There is minimal displaced fracture of the left ninth rib. Question nondisplaced fracture of the left tenth rib. IMPRESSION: No pneumothorax. No acute infiltrate or pulmonary edema. Minimal displaced fracture of the left ninth rib. Question nondisplaced fracture of the left tenth rib. Electronically Signed   By: Lahoma Crocker  M.D.   On: 01/14/2015 13:43   Dg Thoracic Spine 2 View  01/14/2015  CLINICAL DATA:  Pain following fall EXAM: THORACIC SPINE 2 VIEWS COMPARISON:  Chest radiograph July 24, 2014 FINDINGS: Frontal and lateral views were obtained. There is upper thoracic dextroscoliosis with lower thoracic levoscoliosis. Mild anterior wedging of the T12 vertebral body is stable. No new fracture apparent. No spondylolisthesis. There is moderate disc space narrowing at multiple levels in mid thoracic region. No erosive change or paraspinous lesion. IMPRESSION: Mild anterior wedging of the T12 vertebral body is stable. No new fracture. No spondylolisthesis. Scoliosis present. Osteoarthritic change at multiple levels appears stable. Electronically Signed   By: Lowella Grip III M.D.   On: 01/14/2015 13:36   Dg Lumbar Spine Complete  01/14/2015  CLINICAL DATA:  [place] this morning striking technique table. Subsequent pain. EXAM: LUMBAR SPINE - COMPLETE 4+ VIEW COMPARISON:  CT 08/03/2012 FINDINGS: There is thoracic curvature convex to the left in lumbar curvature convex to the right. There is a compression deformity of the T12 vertebral body which  was present in 2014. No evidence of acute fracture. There is degenerative disc disease and degenerative facet disease in the lower lumbar spine. There is atherosclerosis of the aorta with mild aneurysmal dilatation. IMPRESSION: No acute or traumatic finding. Chronic curvature in degenerative changes. Old T12 partial compression fracture. Electronically Signed   By: Nelson Chimes M.D.   On: 01/14/2015 13:34   I have personally reviewed and evaluated these images and lab results as part of my medical decision-making.  Medications  morphine 4 MG/ML injection 4 mg (4 mg Intravenous Given 01/14/15 1343)     MDM   Final diagnoses:  Closed rib fracture, left, initial encounter    Pt's xray show a fx of the rib at the area of tenderness.  Pt states she does not want to be admitted to the hospital.  Will make sure she can ambulate and if so dc with incentive spiromoter and pain meds.   Dorie Rank, MD 01/14/15 [phone]

## 2015-02-06 ENCOUNTER — Inpatient Hospital Stay (HOSPITAL_COMMUNITY)
Admission: EM | Admit: 2015-02-06 | Discharge: 2015-02-25 | DRG: 871 | Disposition: E | Payer: Medicare Other | Attending: Pulmonary Disease | Admitting: Pulmonary Disease

## 2015-02-06 ENCOUNTER — Emergency Department (HOSPITAL_COMMUNITY): Payer: Medicare Other

## 2015-02-06 ENCOUNTER — Encounter (HOSPITAL_COMMUNITY): Payer: Self-pay

## 2015-02-06 DIAGNOSIS — J440 Chronic obstructive pulmonary disease with acute lower respiratory infection: Secondary | ICD-10-CM | POA: Diagnosis present

## 2015-02-06 DIAGNOSIS — Z681 Body mass index (BMI) 19 or less, adult: Secondary | ICD-10-CM

## 2015-02-06 DIAGNOSIS — Z9181 History of falling: Secondary | ICD-10-CM

## 2015-02-06 DIAGNOSIS — J441 Chronic obstructive pulmonary disease with (acute) exacerbation: Secondary | ICD-10-CM | POA: Diagnosis present

## 2015-02-06 DIAGNOSIS — Z79899 Other long term (current) drug therapy: Secondary | ICD-10-CM | POA: Diagnosis not present

## 2015-02-06 DIAGNOSIS — H3530 Unspecified macular degeneration: Secondary | ICD-10-CM | POA: Diagnosis present

## 2015-02-06 DIAGNOSIS — I350 Nonrheumatic aortic (valve) stenosis: Secondary | ICD-10-CM | POA: Diagnosis present

## 2015-02-06 DIAGNOSIS — R64 Cachexia: Secondary | ICD-10-CM | POA: Diagnosis present

## 2015-02-06 DIAGNOSIS — I129 Hypertensive chronic kidney disease with stage 1 through stage 4 chronic kidney disease, or unspecified chronic kidney disease: Secondary | ICD-10-CM | POA: Diagnosis present

## 2015-02-06 DIAGNOSIS — Z66 Do not resuscitate: Secondary | ICD-10-CM | POA: Diagnosis present

## 2015-02-06 DIAGNOSIS — E785 Hyperlipidemia, unspecified: Secondary | ICD-10-CM | POA: Diagnosis present

## 2015-02-06 DIAGNOSIS — J9601 Acute respiratory failure with hypoxia: Secondary | ICD-10-CM | POA: Diagnosis not present

## 2015-02-06 DIAGNOSIS — F17210 Nicotine dependence, cigarettes, uncomplicated: Secondary | ICD-10-CM | POA: Diagnosis present

## 2015-02-06 DIAGNOSIS — J181 Lobar pneumonia, unspecified organism: Secondary | ICD-10-CM | POA: Diagnosis present

## 2015-02-06 DIAGNOSIS — R5381 Other malaise: Secondary | ICD-10-CM | POA: Diagnosis present

## 2015-02-06 DIAGNOSIS — N179 Acute kidney failure, unspecified: Secondary | ICD-10-CM | POA: Diagnosis present

## 2015-02-06 DIAGNOSIS — Z515 Encounter for palliative care: Secondary | ICD-10-CM | POA: Diagnosis not present

## 2015-02-06 DIAGNOSIS — R627 Adult failure to thrive: Secondary | ICD-10-CM | POA: Diagnosis present

## 2015-02-06 DIAGNOSIS — J9621 Acute and chronic respiratory failure with hypoxia: Secondary | ICD-10-CM | POA: Diagnosis present

## 2015-02-06 DIAGNOSIS — E875 Hyperkalemia: Secondary | ICD-10-CM | POA: Diagnosis present

## 2015-02-06 DIAGNOSIS — R739 Hyperglycemia, unspecified: Secondary | ICD-10-CM | POA: Diagnosis present

## 2015-02-06 DIAGNOSIS — N183 Chronic kidney disease, stage 3 (moderate): Secondary | ICD-10-CM | POA: Diagnosis present

## 2015-02-06 DIAGNOSIS — A419 Sepsis, unspecified organism: Secondary | ICD-10-CM | POA: Diagnosis present

## 2015-02-06 DIAGNOSIS — R6520 Severe sepsis without septic shock: Secondary | ICD-10-CM | POA: Diagnosis present

## 2015-02-06 DIAGNOSIS — R0902 Hypoxemia: Secondary | ICD-10-CM | POA: Diagnosis present

## 2015-02-06 DIAGNOSIS — E872 Acidosis, unspecified: Secondary | ICD-10-CM | POA: Diagnosis present

## 2015-02-06 DIAGNOSIS — E43 Unspecified severe protein-calorie malnutrition: Secondary | ICD-10-CM | POA: Diagnosis present

## 2015-02-06 DIAGNOSIS — J439 Emphysema, unspecified: Secondary | ICD-10-CM

## 2015-02-06 DIAGNOSIS — Z8554 Personal history of malignant neoplasm of ureter: Secondary | ICD-10-CM | POA: Diagnosis not present

## 2015-02-06 DIAGNOSIS — Z86711 Personal history of pulmonary embolism: Secondary | ICD-10-CM

## 2015-02-06 LAB — CBC WITH DIFFERENTIAL/PLATELET
BASOS PCT: 0 %
Basophils Absolute: 0 10*3/uL (ref 0.0–0.1)
EOS PCT: 0 %
Eosinophils Absolute: 0 10*3/uL (ref 0.0–0.7)
HEMATOCRIT: 40.4 % (ref 36.0–46.0)
Hemoglobin: 13.2 g/dL (ref 12.0–15.0)
LYMPHS ABS: 0.9 10*3/uL (ref 0.7–4.0)
Lymphocytes Relative: 3 %
MCH: 30.8 pg (ref 26.0–34.0)
MCHC: 32.7 g/dL (ref 30.0–36.0)
MCV: 94.4 fL (ref 78.0–100.0)
MONOS PCT: 6 %
Monocytes Absolute: 1.8 10*3/uL — ABNORMAL HIGH (ref 0.1–1.0)
NEUTROS ABS: 26.6 10*3/uL — AB (ref 1.7–7.7)
Neutrophils Relative %: 91 %
Platelets: 395 10*3/uL (ref 150–400)
RBC: 4.28 MIL/uL (ref 3.87–5.11)
RDW: 14.4 % (ref 11.5–15.5)
WBC: 29.3 10*3/uL — ABNORMAL HIGH (ref 4.0–10.5)

## 2015-02-06 LAB — URINALYSIS, ROUTINE W REFLEX MICROSCOPIC
BILIRUBIN URINE: NEGATIVE
GLUCOSE, UA: NEGATIVE mg/dL
KETONES UR: NEGATIVE mg/dL
Nitrite: NEGATIVE
PH: 5 (ref 5.0–8.0)
Protein, ur: 100 mg/dL — AB
Specific Gravity, Urine: 1.017 (ref 1.005–1.030)

## 2015-02-06 LAB — I-STAT ARTERIAL BLOOD GAS, ED
ACID-BASE DEFICIT: 8 mmol/L — AB (ref 0.0–2.0)
Bicarbonate: 18.8 mEq/L — ABNORMAL LOW (ref 20.0–24.0)
O2 SAT: 92 %
PH ART: 7.267 — AB (ref 7.350–7.450)
PO2 ART: 72 mmHg — AB (ref 80.0–100.0)
TCO2: 20 mmol/L (ref 0–100)
pCO2 arterial: 41.1 mmHg (ref 35.0–45.0)

## 2015-02-06 LAB — URINE MICROSCOPIC-ADD ON

## 2015-02-06 LAB — COMPREHENSIVE METABOLIC PANEL
ALBUMIN: 1.8 g/dL — AB (ref 3.5–5.0)
ALK PHOS: 97 U/L (ref 38–126)
ALT: 73 U/L — AB (ref 14–54)
AST: 67 U/L — ABNORMAL HIGH (ref 15–41)
Anion gap: 17 — ABNORMAL HIGH (ref 5–15)
BUN: 65 mg/dL — ABNORMAL HIGH (ref 6–20)
CALCIUM: 8.3 mg/dL — AB (ref 8.9–10.3)
CO2: 16 mmol/L — AB (ref 22–32)
CREATININE: 1.55 mg/dL — AB (ref 0.44–1.00)
Chloride: 109 mmol/L (ref 101–111)
GFR calc non Af Amer: 29 mL/min — ABNORMAL LOW (ref >60)
GFR, EST AFRICAN AMERICAN: 33 mL/min — AB (ref >60)
GLUCOSE: 177 mg/dL — AB (ref 65–99)
Potassium: 5.3 mmol/L — ABNORMAL HIGH (ref 3.5–5.1)
SODIUM: 142 mmol/L (ref 135–145)
Total Bilirubin: 0.6 mg/dL (ref 0.3–1.2)
Total Protein: 5.6 g/dL — ABNORMAL LOW (ref 6.5–8.1)

## 2015-02-06 LAB — I-STAT CG4 LACTIC ACID, ED
Lactic Acid, Venous: 7.16 mmol/L (ref 0.5–2.0)
Lactic Acid, Venous: 3.14 mmol/L (ref 0.5–2.0)

## 2015-02-06 LAB — I-STAT TROPONIN, ED: Troponin i, poc: 0.06 ng/mL (ref 0.00–0.08)

## 2015-02-06 LAB — GLUCOSE, CAPILLARY: GLUCOSE-CAPILLARY: 130 mg/dL — AB (ref 65–99)

## 2015-02-06 LAB — MRSA PCR SCREENING: MRSA BY PCR: NEGATIVE

## 2015-02-06 MED ORDER — SODIUM CHLORIDE 0.9 % IV BOLUS (SEPSIS)
500.0000 mL | Freq: Once | INTRAVENOUS | Status: AC
Start: 2015-02-06 — End: 2015-02-06
  Administered 2015-02-06: 500 mL via INTRAVENOUS

## 2015-02-06 MED ORDER — ARFORMOTEROL TARTRATE 15 MCG/2ML IN NEBU
15.0000 ug | INHALATION_SOLUTION | Freq: Two times a day (BID) | RESPIRATORY_TRACT | Status: DC
  Administered 2015-02-06 – 2015-02-07 (×3): 15 ug via RESPIRATORY_TRACT
  Filled 2015-02-06 (×6): qty 2

## 2015-02-06 MED ORDER — DEXTROSE 5 % IV SOLN
500.0000 mg | Freq: Once | INTRAVENOUS | Status: AC
  Administered 2015-02-06: 500 mg via INTRAVENOUS
  Filled 2015-02-06 (×2): qty 500

## 2015-02-06 MED ORDER — HEPARIN SODIUM (PORCINE) 5000 UNIT/ML IJ SOLN
5000.0000 [IU] | Freq: Three times a day (TID) | INTRAMUSCULAR | Status: DC
  Administered 2015-02-06 – 2015-02-07 (×4): 5000 [IU] via SUBCUTANEOUS
  Filled 2015-02-06 (×8): qty 1

## 2015-02-06 MED ORDER — VANCOMYCIN HCL IN DEXTROSE 1-5 GM/200ML-% IV SOLN
1000.0000 mg | Freq: Once | INTRAVENOUS | Status: AC
  Administered 2015-02-06: 1000 mg via INTRAVENOUS
  Filled 2015-02-06: qty 200

## 2015-02-06 MED ORDER — LACTATED RINGERS IV SOLN: INTRAVENOUS | Status: DC

## 2015-02-06 MED ORDER — BUDESONIDE 0.5 MG/2ML IN SUSP
0.5000 mg | Freq: Two times a day (BID) | RESPIRATORY_TRACT | Status: DC
  Administered 2015-02-06 – 2015-02-07 (×3): 0.5 mg via RESPIRATORY_TRACT
  Filled 2015-02-06 (×6): qty 2

## 2015-02-06 MED ORDER — ONDANSETRON HCL 4 MG/2ML IJ SOLN: 4.0000 mg | Freq: Four times a day (QID) | INTRAMUSCULAR | Status: DC | PRN

## 2015-02-06 MED ORDER — ACETAMINOPHEN 325 MG PO TABS: 650.0000 mg | ORAL_TABLET | Freq: Four times a day (QID) | ORAL | Status: DC | PRN

## 2015-02-06 MED ORDER — DEXTROSE 5 % IV SOLN
2.0000 g | Freq: Once | INTRAVENOUS | Status: AC
  Administered 2015-02-06: 2 g via INTRAVENOUS
  Filled 2015-02-06: qty 2

## 2015-02-06 MED ORDER — ALPRAZOLAM 0.25 MG PO TABS
0.2500 mg | ORAL_TABLET | Freq: Every evening | ORAL | Status: DC | PRN
  Administered 2015-02-06: 0.25 mg via ORAL
  Filled 2015-02-06: qty 1

## 2015-02-06 MED ORDER — METOPROLOL TARTRATE 1 MG/ML IV SOLN
2.5000 mg | Freq: Once | INTRAVENOUS | Status: AC
  Administered 2015-02-06: 2.5 mg via INTRAVENOUS
  Filled 2015-02-06: qty 5

## 2015-02-06 MED ORDER — SODIUM CHLORIDE 0.9 % IV BOLUS (SEPSIS): 1000.0000 mL | Freq: Once | INTRAVENOUS | Status: DC

## 2015-02-06 MED ORDER — GUAIFENESIN ER 600 MG PO TB12
600.0000 mg | ORAL_TABLET | Freq: Two times a day (BID) | ORAL | Status: DC | PRN
  Filled 2015-02-06: qty 1

## 2015-02-06 MED ORDER — SODIUM CHLORIDE 0.9 % IV BOLUS (SEPSIS)
500.0000 mL | Freq: Once | INTRAVENOUS | Status: AC
  Administered 2015-02-06 – 2015-02-07 (×2): 500 mL via INTRAVENOUS

## 2015-02-06 MED ORDER — VANCOMYCIN HCL 500 MG IV SOLR: 500.0000 mg | INTRAVENOUS | Status: DC

## 2015-02-06 MED ORDER — CETYLPYRIDINIUM CHLORIDE 0.05 % MT LIQD
7.0000 mL | Freq: Two times a day (BID) | OROMUCOSAL | Status: DC
  Administered 2015-02-07 (×3): 7 mL via OROMUCOSAL

## 2015-02-06 MED ORDER — DEXTROSE 5 % IV SOLN
500.0000 mg | INTRAVENOUS | Status: DC
  Filled 2015-02-06: qty 0.5

## 2015-02-06 MED ORDER — ALBUTEROL SULFATE (2.5 MG/3ML) 0.083% IN NEBU
2.5000 mg | INHALATION_SOLUTION | RESPIRATORY_TRACT | Status: DC | PRN
Start: 2015-02-06 — End: 2015-02-08

## 2015-02-06 NOTE — Progress Notes (Signed)
ANTIBIOTIC CONSULT NOTE - INITIAL  Pharmacy Consult for cefepime Indication: sepsis  Allergies  Allergen Reactions  . Penicillins Anaphylaxis    Did PCN reaction causing immediate rash, facial/tongue/throat swelling, SOB or lightheadedness with hypotension:  Did PCN reaction causing severe rash involving mucus membranes or skin necrosis:  Did PCN reaction that required hospitalization  Did PCN reaction occurring within the last 10 years: no If all of the above answers are "NO", then may proceed with Cephalosporin use. Pt states it was about 50-60 years ago, i don't remember what it did. i just haven't taken it since  . Sulfa Antibiotics Anaphylaxis    Swelling and sob  . Bactrim [Sulfamethoxazole-Trimethoprim] Hives  . Pravastatin Other (See Comments)    Increases cpk    Patient Measurements: TBW 43 kg  Vital Signs: Temp: 98.3 F (36.8 C) (01/13 1211) Temp Source: Rectal (01/13 1211) BP: 110/47 mmHg (01/13 1215) Pulse Rate: 93 (01/13 1215) Intake/Output from previous day:   Intake/Output from this shift: Total I/O In: 1000 [I.V.:500; IV Piggyback:500] Out: 24 [Urine:24]  Labs: No results for input(s): WBC, HGB, PLT, LABCREA, CREATININE in the last 72 hours. CrCl cannot be calculated (Unknown ideal weight.). No results for input(s): VANCOTROUGH, VANCOPEAK, VANCORANDOM, GENTTROUGH, GENTPEAK, GENTRANDOM, TOBRATROUGH, TOBRAPEAK, TOBRARND, AMIKACINPEAK, AMIKACINTROU, AMIKACIN in the last 72 hours.   Microbiology: No results found for this or any previous visit (from the past 720 hour(s)).  Medical History: Past Medical History  Diagnosis Date  . Hypertension   . COPD (chronic obstructive pulmonary disease) ([place])   . Macular degeneration   . History of pulmonary embolism     POST OP SURG 1948  . History of malignant neoplasm of ureter     S/P LEFT URETERECTOMY W/ REIMPLANTATION 2012 --  [facility]  . Diverticulosis of colon   . Hyperlipidemia   . Environmental allergies    . Asymptomatic stenosis of right carotid artery     MILD RIGHT ICA  40-50% PER DR [place] NOTE  . MGUS (monoclonal gammopathy of unknown significance)     HEMATOLOGIST-- DR Alen Blew  . Aortic stenosis, mild   . CKD (chronic kidney disease), stage III   . Bladder tumor   . GERD (gastroesophageal reflux disease)     NO MEDS    Assessment: 82 yo F presents on 1/13 with possible sepsis. Pharmacy consulted to dose cefepime. Give vancomycin and azithromycin x 1 in the ED. Afebrile, WBC elevated at 29.3. SCr pending  Goal of Therapy:  Vancomycin trough level 15-20 mcg/ml  Resolution of infection  Plan:  Give cefepime 2g IV x 1, then start  Monitor clinical picture, renal function F/U C&S, abx deescalation / LOT  F/U need to continue vancomycin / azithromycin  [name], PharmD, BCPS Clinical Pharmacist Pager [phone] 02/03/2015 12:41 PM   ADDENDUM:  Pharmacy also consulted for vancomycin. Already received vancomycin 1g IV load dose in the ED. SCr elevated at 1.55. CrCl ~73ml/min.  Plan: Continue cefepime 500mg  IV Q24 Continue vancomycin 500mg  IV Q48 Monitor clinical picture, renal function, VT at Css F/U C&S, abx deescalation / LOT  [name], PharmD, BCPS Clinical Pharmacist Pager [phone] 02/01/2015 2:17 PM

## 2015-02-06 NOTE — ED Provider Notes (Signed)
CSN: [id number]     Arrival date & time 01/28/2015  1152 History   First MD Initiated Contact with Patient 02/18/2015 1154     Chief Complaint  Patient presents with  . Code Sepsis     (Consider location/radiation/quality/duration/timing/severity/associated sxs/prior Treatment) HPI  Level V caveat due to AMS on arrival History provided by EMS and patient's grandson. 82 year old female who presents with concern for sepsis. History of COPD, CKD III, GERD, HLD, mild AS. Recent history of pneumonia diagnosed by PCP last week and also treated for UTI. Finished course of z-pack according to grandson, who lives at home with her. Not eating or drinking well for past week. Appeared more dyspneic and weak today to grandson.  Collapsed walking with assistance to bathroom EMS called, and patient was lethargic and unresponsive. Unable to obtain pulse ox or blood pressure initially but was tachycardic 150s. Placed on CPAP after BVM assisted breathing. Poct glucose 200s.   Past Medical History  Diagnosis Date  . Hypertension   . COPD (chronic obstructive pulmonary disease) ([place])   . Macular degeneration   . History of pulmonary embolism     POST OP SURG 1948  . History of malignant neoplasm of ureter     S/P LEFT URETERECTOMY W/ REIMPLANTATION 2012 --  [facility]  . Diverticulosis of colon   . Hyperlipidemia   . Environmental allergies   . Asymptomatic stenosis of right carotid artery     MILD RIGHT ICA  40-50% PER DR [place] NOTE  . MGUS (monoclonal gammopathy of unknown significance)     HEMATOLOGIST-- DR Alen Blew  . Aortic stenosis, mild   . CKD (chronic kidney disease), stage III   . Bladder tumor   . GERD (gastroesophageal reflux disease)     NO MEDS   Past Surgical History  Procedure Laterality Date  . Cesarean section  [place]  . Cysto/ left retrograde pyelogram/ bx left ureteral tumor/ stent placement  06-15-2010  . Left distal ureterectomy/  left ureteral reimplantation  08-13-2010    TRANSITIONAL CELL CARCINOMA  . Appendectomy  1948  . Benign breast bx    . Cardiovascular stress test  12-04-2007  DR [place]    NORMAL NUCLER STUDY/ NORMAL LVF  . Transthoracic echocardiogram  02-22-2011    MILD LVH/ EF 123456 GRADE I DIASTOLIC DYSFUNCTION/ MILD AORTIC STENOSIS/ MILD MR  . Abdominal hysterectomy  1979    W/ BILATERAL SALPINGOOPHECTOMY  . Transurethral resection of bladder tumor N/A 08/21/2012    Procedure: TRANSURETHRAL RESECTION OF BLADDER TUMOR (TURBT);  Surgeon: Hanley Ben, MD;  Location: [facility];  Service: Urology;  Laterality: N/A;  . Cystoscopy/retrograde/ureteroscopy Left 08/21/2012    Procedure: CYSTOSCOPY/RETROGRADE/URETEROSCOPY;  Surgeon: Hanley Ben, MD;  Location: [facility];  Service: Urology;  Laterality: Left;   History reviewed. No pertinent family history. Social History  Substance Use Topics  . Smoking status: Current Every Day Smoker -- 0.10 packs/day for 50 years    Types: Cigarettes  . Smokeless tobacco: Never Used     Comment: SMOKES 1 CIGERETTE PER DAY  . Alcohol Use: No   OB History    No data available     Review of Systems  Unable to perform ROS: Mental status change      Allergies  Penicillins; Sulfa antibiotics; Bactrim; and Pravastatin  Home Medications   Prior to Admission medications   Medication Sig Start Date End Date Taking? Authorizing Provider  albuterol (PROVENTIL HFA;VENTOLIN  HFA) 108 (90 BASE) MCG/ACT inhaler Inhale 2 puffs into the lungs every 6 (six) hours as needed for wheezing or shortness of breath (wheezing & shortness of breath).    Historical Provider, MD  ALPRAZolam Duanne Moron) 0.5 MG tablet Take 0.5 mg by mouth at bedtime as needed for sleep.     Historical Provider, MD  guaiFENesin (MUCINEX) 600 MG 12 hr tablet Take 600 mg by mouth 2 (two) times daily as needed for cough.    Historical Provider, MD  HYDROcodone-acetaminophen (NORCO/VICODIN) 5-325 MG tablet Take 1  tablet by mouth every 4 (four) hours as needed. 01/14/15   Dorie Rank, MD  losartan (COZAAR) 50 MG tablet Take 1 tablet (50 mg total) by mouth daily. 01/09/15   Everlene Balls, MD  mometasone-formoterol (DULERA) 200-5 MCG/ACT AERO Inhale 2 puffs into the lungs 2 (two) times daily.    Historical Provider, MD  Multiple Vitamins-Minerals (PRESERVISION AREDS PO) Take 1 capsule by mouth 2 (two) times daily.    Historical Provider, MD  tobramycin (TOBREX) 0.3 % ophthalmic solution Place 1 drop into the right eye 4 (four) times daily as needed. To start 1 day before eyecare appointment and end 1 day after appointment 01/09/15   Historical Provider, MD   BP 102/56 mmHg  Pulse 75  Temp(Src) 98.3 F (36.8 C) (Rectal)  Resp 24  Wt 88 lb (39.917 kg)  SpO2 96% Physical Exam Physical Exam  Nursing note and vitals reviewed. Constitutional: Cachectic appearing elderly woman appearing acutely ill Head: Normocephalic and atraumatic.  Mouth/Throat: Oropharynx is clear. Mucous membranes are dry. Neck: Normal range of motion. Neck supple.  Cardiovascular: Normal rate and regular rhythm.  No edema Pulmonary/Chest: Tachypnea. Rhonchi diffusely over the left lung to ausculation.  Abdominal: Soft. There is no tenderness. There is no rebound and no guarding.  Musculoskeletal: No deformities. Skin tear over right forearm.  Neurological: Moves all extremities symmetrically to command, no facial droop, appears lethargic and difficult to understand but responds to voice and commands Skin: Skin is cool and dry.  Psychiatric: Cooperative  ED Course  Procedures (including critical care time) Labs Review Labs Reviewed  COMPREHENSIVE METABOLIC PANEL - Abnormal; Notable for the following:    Potassium 5.3 (*)    CO2 16 (*)    Glucose, Bld 177 (*)    BUN 65 (*)    Creatinine, Ser 1.55 (*)    Calcium 8.3 (*)    Total Protein 5.6 (*)    Albumin 1.8 (*)    AST 67 (*)    ALT 73 (*)    GFR calc non Af Amer 29 (*)     GFR calc Af Amer 33 (*)    Anion gap 17 (*)    All other components within normal limits  CBC WITH DIFFERENTIAL/PLATELET - Abnormal; Notable for the following:    WBC 29.3 (*)    Neutro Abs 26.6 (*)    Monocytes Absolute 1.8 (*)    All other components within normal limits  URINALYSIS, ROUTINE W REFLEX MICROSCOPIC (NOT AT [facility]) - Abnormal; Notable for the following:    APPearance CLOUDY (*)    Hgb urine dipstick LARGE (*)    Protein, ur 100 (*)    Leukocytes, UA SMALL (*)    All other components within normal limits  URINE MICROSCOPIC-ADD ON - Abnormal; Notable for the following:    Squamous Epithelial / LPF 0-5 (*)    Bacteria, UA MANY (*)    Casts GRANULAR CAST (*)  All other components within normal limits  I-STAT CG4 LACTIC ACID, ED - Abnormal; Notable for the following:    Lactic Acid, Venous 7.16 (*)    All other components within normal limits  I-STAT ARTERIAL BLOOD GAS, ED - Abnormal; Notable for the following:    pH, Arterial 7.267 (*)    pO2, Arterial 72.0 (*)    Bicarbonate 18.8 (*)    Acid-base deficit 8.0 (*)    All other components within normal limits  I-STAT CG4 LACTIC ACID, ED - Abnormal; Notable for the following:    Lactic Acid, Venous 3.14 (*)    All other components within normal limits  CULTURE, BLOOD (ROUTINE X 2)  CULTURE, BLOOD (ROUTINE X 2)  URINE CULTURE  I-STAT TROPOININ, ED    Imaging Review Dg Chest Portable 1 View  02/22/2015  CLINICAL DATA:  Short of breath and sepsis. History of hypertension and COPD. EXAM: PORTABLE CHEST 1 VIEW COMPARISON:  01/27/2015. FINDINGS: Consolidation in the left lung has progressed since the prior study now involving most of the left lung with some sparing of the left apex. Right lung is mildly hyperexpanded but clear. Cardiac silhouette is normal in size. No mediastinal or hilar masses. There may be a left pleural effusion. No evidence of a right effusion. No pneumothorax. IMPRESSION: 1. Worsened lung consolidation  on the left no involving most of the left lung. Findings are consistent with worsened pneumonia. Electronically Signed   By: Lajean Manes M.D.   On: 02/15/2015 12:20   I have personally reviewed and evaluated these images and lab results as part of my medical decision-making.   EKG Interpretation   Date/Time:  Friday February 06 2015 11:58:35 EST Ventricular Rate:  94 PR Interval:  116 QRS Duration: 92 QT Interval:  337 QTC Calculation: 421 R Axis:   60 Text Interpretation:  Sinus rhythm Borderline short PR interval Probable  left atrial enlargement LVH with secondary repolarization abnormality  Anterior Q waves, possibly due to LVH No significant change since last  tracing Confirmed by Paulanthony Gleaves MD, Everli Rother [phone]) on 02/16/2015 3:39:09 PM     CRITICAL CARE Performed by: Forde Dandy   Total critical care time: 35 minutes  Critical care time was exclusive of separately billable procedures and treating other patients.  Critical care was necessary to treat or prevent imminent or life-threatening deterioration.  Critical care was time spent personally by me on the following activities: development of treatment plan with patient and/or surrogate as well as nursing, discussions with consultants, evaluation of patient's response to treatment, examination of patient, obtaining history from patient or surrogate, ordering and performing treatments and interventions, ordering and review of laboratory studies, ordering and review of radiographic studies, pulse oximetry and re-evaluation of patient's condition.  MDM   Final diagnoses:  Severe sepsis ([place])  Acute respiratory failure with hypoxia ([facility])  Lobar pneumonia ([place])    82 year old female who presents with severe sepsis due to progressive/worsening pneumonia. Unable to initially obtain adequate pulse ox. ABG showing PO2 72 on BiPAP. Kept on BiPAP given lower pO2 on ABG. She has AKI with lactate of 7 and leukocytosis of 29. CXR with diffuse  infiltrate/opacities over left lung c/w worsening pneumonia. Empirically started on cefepime and vancomycin and fluid resuscitated. Discussed with ICU and admitted for ongoing management.    Forde Dandy, MD 02/05/2015 [phone]

## 2015-02-06 NOTE — ED Notes (Signed)
Pt removed from BiPAP and placed on 6LPM N/C per Dr. Halford Chessman and Marni Griffon, NP.

## 2015-02-06 NOTE — H&P (Signed)
PULMONARY / CRITICAL CARE MEDICINE   Name: [name] MRN: [id number] DOB: 21-Aug-1924    ADMISSION DATE:  01/25/2015 CONSULTATION DATE:  1/13  REFERRING MD:  Oleta Mouse  CHIEF COMPLAINT:  Acute hypoxic respiratory failure  HISTORY OF PRESENT ILLNESS:   This is a 82 year old female w/ what appears to be severe COPD w/ evolving failure to thrive as evidenced by falls at home, decreased activity tolerance and wt loss. Presents to the ED on 1/13 in acute distress. Per grandson who is her primary caregiver she had been in usual state of health until around Christmas when she was treated for an UTI. During that time she fell and sustained some chest trauma and was bedbound for a number of days. Then about a week prior to her presenting to the ED she went to her PCP w/ cough and dyspnea at which point she was diagnosed w/ CAP and treated w/ zpak and steroids. She continued to decline in-spite of this therapy and became progressively short of breath, weak and then stopped eating and drinking 2d prior to admit. Apparently she was ambulating to the BR w/ grandson prior to admit and collapsed. He called EMS, she was unresponsive on arrival and required CPAP in transport. Her initial ABG showed sig hypoxia w/ P/f ratio of 120. And CXR w/ significant left sided PNA. PCCM was asked to admit   PAST MEDICAL HISTORY :  She  has a past medical history of Hypertension; COPD (chronic obstructive pulmonary disease) ([place]); Macular degeneration; History of pulmonary embolism; History of malignant neoplasm of ureter; Diverticulosis of colon; Hyperlipidemia; Environmental allergies; Asymptomatic stenosis of right carotid artery; MGUS (monoclonal gammopathy of unknown significance); Aortic stenosis, mild; CKD (chronic kidney disease), stage III; Bladder tumor; and GERD (gastroesophageal reflux disease).  PAST SURGICAL HISTORY: She  has past surgical history that includes Cesarean section (1970  &  1979); CYSTO/ LEFT  RETROGRADE PYELOGRAM/ BX LEFT URETERAL TUMOR/ STENT PLACEMENT (06-15-2010); LEFT DISTAL URETERECTOMY/  LEFT URETERAL REIMPLANTATION (08-13-2010); Appendectomy (1948); BENIGN BREAST BX; Cardiovascular stress test (12-04-2007  DR Martinique); transthoracic echocardiogram (02-22-2011); Abdominal hysterectomy (1979); Transurethral resection of bladder tumor (N/A, 08/21/2012); and Cystoscopy/retrograde/ureteroscopy (Left, 08/21/2012).  Allergies  Allergen Reactions  . Penicillins Anaphylaxis    Did PCN reaction causing immediate rash, facial/tongue/throat swelling, SOB or lightheadedness with hypotension:  Did PCN reaction causing severe rash involving mucus membranes or skin necrosis:  Did PCN reaction that required hospitalization  Did PCN reaction occurring within the last 10 years: no If all of the above answers are "NO", then may proceed with Cephalosporin use. Pt states it was about 50-60 years ago, i don't remember what it did. i just haven't taken it since  . Sulfa Antibiotics Anaphylaxis    Swelling and sob  . Bactrim [Sulfamethoxazole-Trimethoprim] Hives  . Pravastatin Other (See Comments)    Increases cpk    No current facility-administered medications on file prior to encounter.   Current Outpatient Prescriptions on File Prior to Encounter  Medication Sig  . albuterol (PROVENTIL HFA;VENTOLIN HFA) 108 (90 BASE) MCG/ACT inhaler Inhale 2 puffs into the lungs every 6 (six) hours as needed for wheezing or shortness of breath (wheezing & shortness of breath).  . ALPRAZolam (XANAX) 0.5 MG tablet Take 0.5 mg by mouth at bedtime as needed for sleep.   Marland Kitchen guaiFENesin (MUCINEX) 600 MG 12 hr tablet Take 600 mg by mouth 2 (two) times daily as needed for cough.  Marland Kitchen HYDROcodone-acetaminophen (NORCO/VICODIN) 5-325 MG tablet Take 1  tablet by mouth every 4 (four) hours as needed.  Marland Kitchen losartan (COZAAR) 50 MG tablet Take 1 tablet (50 mg total) by mouth daily.  . mometasone-formoterol (DULERA) 200-5 MCG/ACT AERO  Inhale 2 puffs into the lungs 2 (two) times daily.  . Multiple Vitamins-Minerals (PRESERVISION AREDS PO) Take 1 capsule by mouth 2 (two) times daily.  . nitrofurantoin, macrocrystal-monohydrate, (MACROBID) 100 MG capsule Take 100 mg by mouth 2 (two) times daily.  Marland Kitchen tobramycin (TOBREX) 0.3 % ophthalmic solution Place 1 drop into the right eye 4 (four) times daily as needed. To start 1 day before eyecare appointment and end 1 day after appointment    FAMILY HISTORY:  Her has no family status information on file.   SOCIAL HISTORY: She  reports that she has been smoking Cigarettes.  She has a 5 pack-year smoking history. She has never used smokeless tobacco. She reports that she does not drink alcohol or use illicit drugs.  REVIEW OF SYSTEMS:   Gen: + weakness, subjective fever HEENT: mouth dry. Pulm: cough thick yellow sputum, increased shortness of breath at rest, no cp, occ wheeze, some relief w/ rescue SABA. Card: no palps, light headedness or dizziness. No CP. Abd: no N/V/diarrhea. Has not wanted to eat in last 2 days.  Poor liquid intake as well. GU: no complaints. Muscle: no focal weakness, just generalized. Neuro: intermittent confusion, not present currently. Endo: no issues   SUBJECTIVE:  Thirsty, mouth dry. Wants to cough   VITAL SIGNS: BP 108/50 mmHg  Pulse 82  Temp(Src) 98.3 F (36.8 C) (Rectal)  Resp 32  Wt 39.917 kg (88 lb)  SpO2 100%  HEMODYNAMICS:    VENTILATOR SETTINGS:    INTAKE / OUTPUT:    PHYSICAL EXAMINATION: General:  Frail 82 year old female, on BIPAP w/ sig WOB Neuro:  Awake, oriented. No focal def. Very weak HEENT:  Temporal wasting, MM are dry, crusting and cracked  Cardiovascular:  rrr + do not appreciate murmur Lungs:  + accessory muscle use, diffuse rales/rhonchi, decreased on left  Abdomen:  Soft, not tender + bowel sounds  Musculoskeletal:  Weak but equal st and bulk  Skin:  Dry, warm and intact.   LABS:  BMET  Recent Labs Lab  02/11/2015 1244  NA 142  K 5.3*  CL 109  CO2 16*  BUN 65*  CREATININE 1.55*  GLUCOSE 177*    Electrolytes  Recent Labs Lab 02/02/2015 1244  CALCIUM 8.3*    CBC  Recent Labs Lab 02/05/2015 1244  WBC 29.3*  HGB 13.2  HCT 40.4  PLT 395    Coag's No results for input(s): APTT, INR in the last 168 hours.  Sepsis Markers  Recent Labs Lab 02/17/2015 1211  LATICACIDVEN 7.16*    ABG  Recent Labs Lab 02/17/2015 1229  PHART 7.267*  PCO2ART 41.1  PO2ART 72.0*    Liver Enzymes  Recent Labs Lab 01/25/2015 1244  AST 67*  ALT 73*  ALKPHOS 97  BILITOT 0.6  ALBUMIN 1.8*    Cardiac Enzymes No results for input(s): TROPONINI, PROBNP in the last 168 hours.  Glucose No results for input(s): GLUCAP in the last 168 hours.  Imaging Dg Chest Portable 1 View  02/23/2015  CLINICAL DATA:  Short of breath and sepsis. History of hypertension and COPD. EXAM: PORTABLE CHEST 1 VIEW COMPARISON:  01/27/2015. FINDINGS: Consolidation in the left lung has progressed since the prior study now involving most of the left lung with some sparing of the left apex.  Right lung is mildly hyperexpanded but clear. Cardiac silhouette is normal in size. No mediastinal or hilar masses. There may be a left pleural effusion. No evidence of a right effusion. No pneumothorax. IMPRESSION: 1. Worsened lung consolidation on the left no involving most of the left lung. Findings are consistent with worsened pneumonia. Electronically Signed   By: Lajean Manes M.D.   On: 02/23/2015 12:20     STUDIES:   CULTURES: BCX2 1/13>>>  ANTIBIOTICS: vanc 1/13>>> Cefepime 11/13>>>  SIGNIFICANT EVENTS:   LINES/TUBES:   DISCUSSION: 82 year old female w/ severe COPD and baseline malnutrition. Admitted 1/13 w/ acute hypoxic resp failure in setting of severe left sided CAP and element of AECOPD. Will admit to ICU, add scheduled BDs, flutter, empiric abx, and PRN NIPPV. Spoke to the grandson AND the patient. She does  NOT want to be intubated. She is a very poor candidate for CPR given co-morbids. She agrees to DNR status (grandson at bedside for this discussion).   ASSESSMENT / PLAN:  Acute hypoxic respiratory failure in setting of severe left sided CAP and further c/b AECOPD Admit to ICU BIPAP PRN-->no intubation Scheduled BDs Add flutter  Hold off on systemic steroids F/u blood cultures   Severe sepsis  Cont IVFs See above re: abx Admit to ICU   H/o mod AS and HTN Holding antihypertensives from home for now   Lactic acidosis in setting of respiratory failure and sepsis. She is DNR and desires conservative treatment but no invasive interventions.  Hyperkalemia  AKI Cont IVFs  Hold antihypertensives Renal dose meds   H/o monoclonal gammopathy Leukocytosis in setting of CAP [place] heparin  Trend CBC   Mild hyperglycemia  ssi protocol   Protein calorie Malnutrition Clears for now given WOB, but will need dietary consult given wt loss  Macular Degeneration Cont home eye gtts  FAMILY  - Updates: grandson christopher updated. Will admit to ICU. BIPAP PRN, fluids, abx and medical rx yes.  No pressors, no central access. DNR/DNI.   - Inter-disciplinary family meet or Palliative Care meeting due by:  Completed on admit   Erick Colace ACNP-BC [place] Pager # [phone] OR # [phone] if no answer   02/05/2015, 2:05 PM

## 2015-02-06 NOTE — Progress Notes (Signed)
RT placed patient on BIPAP due to increased WOB, HR 145, and O2 Sats in mid 80s. RT will continue to monitor.

## 2015-02-06 NOTE — Progress Notes (Signed)
Spoke with Dr. Posey Pronto concerning pt's anxiety and HR 140's.  Received order for 0.25mg  Xanax.  Will continue to monitor.

## 2015-02-06 NOTE — Progress Notes (Addendum)
Pharmacy Code Sepsis Protocol  Time of code sepsis page: 1205 Time of antibiotic delivery: [id number]  Were antibiotics ordered at the time of the code sepsis page? Yes   Pharmacy then consulted at 1216 to dose cefepime and vancomycin at 49  Was it required to contact the physician? [x]  Physician not contacted []  Physician contacted to order antibiotics for code sepsis []  Physician contacted to recommend changing antibiotics  Pharmacy consulted for: Cefepime  Anti-infectives    Start     Dose/Rate Route Frequency Ordered Stop   02/24/2015 1230  vancomycin (VANCOCIN) IVPB 1000 mg/200 mL premix     1,000 mg 200 mL/hr over 60 Minutes Intravenous  Once 02/10/2015 1215     02/02/2015 1230  azithromycin (ZITHROMAX) 500 mg in dextrose 5 % 250 mL IVPB     500 mg 250 mL/hr over 60 Minutes Intravenous  Once 02/03/2015 1215     01/26/2015 1230  ceFEPIme (MAXIPIME) 2 g in dextrose 5 % 50 mL IVPB     2 g 100 mL/hr over 30 Minutes Intravenous  Once 02/23/2015 1224        @infusions @   Nurse education provided: [x]  Minutes left to administer antibiotics to achieve 1 hour goal []  Correct order of antibiotic administration []  Antibiotic Y-site compatibilities    [name], PharmD, BCPS Clinical Pharmacist Pager [phone] 02/09/2015 12:29 PM

## 2015-02-06 NOTE — ED Notes (Signed)
Code sepsis activated.

## 2015-02-06 NOTE — ED Notes (Signed)
Pt. BIB EMS as possible sepsis. Pt. Lives with grandsons, they were ambulating pt. To bathroom when she collapsed. Hx of recent PNA. Grandsons stating that urine has had foul odor recently. When EMS arrived, pt. Was unresponsive, responding only to pain intermittently. Pt. Was bagged and placed on CPAP. Pt. Given 500 ml NS.

## 2015-02-06 NOTE — ED Notes (Signed)
Pt now in sitting position in bed.  Verified BP accurate.  Pt mentation has not changed and has requested to remain sitting up. Family at bedside.

## 2015-02-06 NOTE — ED Notes (Signed)
Admitting critical care at bedside.

## 2015-02-07 ENCOUNTER — Inpatient Hospital Stay (HOSPITAL_COMMUNITY): Payer: Medicare Other

## 2015-02-07 DIAGNOSIS — Z66 Do not resuscitate: Secondary | ICD-10-CM

## 2015-02-07 LAB — BASIC METABOLIC PANEL
ANION GAP: 10 (ref 5–15)
BUN: 61 mg/dL — AB (ref 6–20)
CO2: 21 mmol/L — ABNORMAL LOW (ref 22–32)
Calcium: 8 mg/dL — ABNORMAL LOW (ref 8.9–10.3)
Chloride: 110 mmol/L (ref 101–111)
Creatinine, Ser: 1.25 mg/dL — ABNORMAL HIGH (ref 0.44–1.00)
GFR calc Af Amer: 43 mL/min — ABNORMAL LOW (ref >60)
GFR calc non Af Amer: 37 mL/min — ABNORMAL LOW (ref >60)
Glucose, Bld: 63 mg/dL — ABNORMAL LOW (ref 65–99)
Potassium: 5.4 mmol/L — ABNORMAL HIGH (ref 3.5–5.1)
SODIUM: 141 mmol/L (ref 135–145)

## 2015-02-07 LAB — CBC
HCT: 40.1 % (ref 36.0–46.0)
Hemoglobin: 12.9 g/dL (ref 12.0–15.0)
MCH: 30.4 pg (ref 26.0–34.0)
MCHC: 32.2 g/dL (ref 30.0–36.0)
MCV: 94.4 fL (ref 78.0–100.0)
PLATELETS: 371 10*3/uL (ref 150–400)
RBC: 4.25 MIL/uL (ref 3.87–5.11)
RDW: 14.6 % (ref 11.5–15.5)
WBC: 21.2 10*3/uL — AB (ref 4.0–10.5)

## 2015-02-07 MED ORDER — VANCOMYCIN HCL IN DEXTROSE 750-5 MG/150ML-% IV SOLN
750.0000 mg | INTRAVENOUS | Status: DC
  Administered 2015-02-07: 750 mg via INTRAVENOUS
  Filled 2015-02-07 (×2): qty 150

## 2015-02-07 MED ORDER — SODIUM CHLORIDE 0.9 % IV BOLUS (SEPSIS)
500.0000 mL | Freq: Once | INTRAVENOUS | Status: AC
  Administered 2015-02-07: 500 mL via INTRAVENOUS

## 2015-02-07 MED ORDER — MORPHINE SULFATE (PF) 2 MG/ML IV SOLN
2.0000 mg | INTRAVENOUS | Status: DC | PRN
  Administered 2015-02-07 – 2015-02-08 (×3): 2 mg via INTRAVENOUS
  Filled 2015-02-07 (×3): qty 1

## 2015-02-07 MED ORDER — ALPRAZOLAM 0.25 MG PO TABS
0.2500 mg | ORAL_TABLET | Freq: Every evening | ORAL | Status: DC | PRN
  Administered 2015-02-07: 0.25 mg via ORAL
  Filled 2015-02-07: qty 1

## 2015-02-07 MED ORDER — VANCOMYCIN HCL IN DEXTROSE 750-5 MG/150ML-% IV SOLN
750.0000 mg | INTRAVENOUS | Status: DC
  Filled 2015-02-07: qty 150

## 2015-02-07 MED ORDER — LORAZEPAM 2 MG/ML IJ SOLN: 0.5000 mg | INTRAMUSCULAR | Status: DC | PRN

## 2015-02-07 MED ORDER — DEXTROSE 5 % IV SOLN
1.0000 g | INTRAVENOUS | Status: DC
  Administered 2015-02-07: 1 g via INTRAVENOUS
  Filled 2015-02-07 (×2): qty 1

## 2015-02-07 NOTE — Progress Notes (Signed)
   02/07/15 1906  Clinical Encounter Type  Visited With Patient and family together  Visit Type Follow-up;Critical Care  Referral From Nurse   Chaplain responded to a Palliative Care consult, and followed up with the patient and patient's family. Chaplain offered support, and family seems fine at this time. Chaplain support available as needed.   Jeri Lager, Chaplain 02/07/2015 7:07 PM

## 2015-02-07 NOTE — Progress Notes (Signed)
Family present with patient, who appears to be resting comfortably.  Respirations continue to be labored, though HR is now in 80's.  Pt. Indicates increased comfort by nodding head appropriately to questions.  Daughter does not wish patient to "sleep all the time" and attempting to offer multiple rationales for her current state of health, often blaming herself for "not bringing her to the doctor's sooner."  Frequently waking up her mother despite her mother's desire to sleep and rest.

## 2015-02-07 NOTE — Progress Notes (Signed)
PULMONARY / CRITICAL CARE MEDICINE   Name: [name] MRN: [id number] DOB: 02/10/1924    ADMISSION DATE:  02/03/2015 CONSULTATION DATE:  1/13  REFERRING MD:  Oleta Mouse  CHIEF COMPLAINT:  Acute hypoxic respiratory failure  HISTORY OF PRESENT ILLNESS:   This is a 82 year old female w/ what appears to be severe COPD w/ evolving failure to thrive as evidenced by falls at home, decreased activity tolerance and wt loss. Presents to the ED on 1/13 in acute distress. Per grandson who is her primary caregiver she had been in usual state of health until around Christmas when she was treated for an UTI. During that time she fell and sustained some chest trauma and was bedbound for a number of days. Then about a week prior to her presenting to the ED she went to her PCP w/ cough and dyspnea at which point she was diagnosed w/ CAP and treated w/ zpak and steroids. She continued to decline in-spite of this therapy and became progressively short of breath, weak and then stopped eating and drinking 2d prior to admit. Apparently she was ambulating to the BR w/ grandson prior to admit and collapsed. He called EMS, she was unresponsive on arrival and required CPAP in transport. Her initial ABG showed sig hypoxia w/ P/f ratio of 120. And CXR w/ significant left sided PNA. PCCM was asked to admit   SUBJECTIVE:  BiPAP off this am, tolerated it overnight  VITAL SIGNS: BP 102/50 mmHg  Pulse 80  Temp(Src) 98.3 F (36.8 C) (Axillary)  Resp 33  Wt 41.7 kg (91 lb 14.9 oz)  SpO2 100%  HEMODYNAMICS:    VENTILATOR SETTINGS: Vent Mode:  [-]  FiO2 (%):  [60 %-100 %] 100 %  INTAKE / OUTPUT: I/O last 3 completed shifts: In: 2550 [P.O.:30; I.V.:1020; IV Piggyback:1500] Out: 24 [Urine:24]  PHYSICAL EXAMINATION: General:  Frail 82 year old female, off BIPAP w/ accessory muscle use Neuro:  Awake, oriented. No focal def. Very weak HEENT:  Temporal wasting, MM are dry, UA secretions heard Cardiovascular:  rrr + do  not appreciate murmur Lungs:  + accessory muscle use, diffuse rales/rhonchi, decreased on left  Abdomen:  Soft, not tender + bowel sounds  Musculoskeletal:  Weak but equal st and bulk  Skin:  Dry, warm and intact.   LABS:  BMET  Recent Labs Lab 02/16/2015 1244 02/07/15 0525  NA 142 141  K 5.3* 5.4*  CL 109 110  CO2 16* 21*  BUN 65* 61*  CREATININE 1.55* 1.25*  GLUCOSE 177* 63*    Electrolytes  Recent Labs Lab 02/20/2015 1244 02/07/15 0525  CALCIUM 8.3* 8.0*    CBC  Recent Labs Lab 02/05/2015 1244 02/07/15 0525  WBC 29.3* 21.2*  HGB 13.2 12.9  HCT 40.4 40.1  PLT 395 371    Coag's No results for input(s): APTT, INR in the last 168 hours.  Sepsis Markers  Recent Labs Lab 02/12/2015 1211 02/20/2015 1533  LATICACIDVEN 7.16* 3.14*    ABG  Recent Labs Lab 02/05/2015 1229  PHART 7.267*  PCO2ART 41.1  PO2ART 72.0*    Liver Enzymes  Recent Labs Lab 02/05/2015 1244  AST 67*  ALT 73*  ALKPHOS 97  BILITOT 0.6  ALBUMIN 1.8*    Cardiac Enzymes No results for input(s): TROPONINI, PROBNP in the last 168 hours.  Glucose  Recent Labs Lab 02/03/2015 1737  GLUCAP 130*    Imaging Dg Chest Port 1 View  02/07/2015  CLINICAL DATA:  Follow-up  pneumonia.  Subsequent encounter. EXAM: PORTABLE CHEST 1 VIEW COMPARISON:  Chest radiograph from 02/05/2014 FINDINGS: There is persistent diffuse left-sided airspace opacification, reflecting perhaps slightly worsened pneumonia. The right lung remains clear. A small left pleural effusion may be present. No pneumothorax is seen. The cardiomediastinal silhouette is borderline enlarged. No acute osseous abnormalities are identified. IMPRESSION: Persistent diffuse left-sided airspace opacification, concerning for slightly worsened diffuse pneumonia. Small left pleural effusion may be present. Borderline cardiomegaly. Electronically Signed   By: Garald Balding M.D.   On: 02/07/2015 01:04   Dg Chest Portable 1 View  02/11/2015   CLINICAL DATA:  Short of breath and sepsis. History of hypertension and COPD. EXAM: PORTABLE CHEST 1 VIEW COMPARISON:  01/27/2015. FINDINGS: Consolidation in the left lung has progressed since the prior study now involving most of the left lung with some sparing of the left apex. Right lung is mildly hyperexpanded but clear. Cardiac silhouette is normal in size. No mediastinal or hilar masses. There may be a left pleural effusion. No evidence of a right effusion. No pneumothorax. IMPRESSION: 1. Worsened lung consolidation on the left no involving most of the left lung. Findings are consistent with worsened pneumonia. Electronically Signed   By: Lajean Manes M.D.   On: 02/03/2015 12:20     STUDIES:   CULTURES: BCX2 1/13>>>  ANTIBIOTICS: vanc 1/13>>> Cefepime 11/13>>>  SIGNIFICANT EVENTS:   LINES/TUBES:   DISCUSSION: 82 year old female w/ severe COPD and baseline malnutrition. Admitted 1/13 w/ acute hypoxic resp failure in setting of severe left sided CAP and element of AECOPD. Admitted to ICU, NIPPV. Spoke to the grandson AND the patient. She does NOT want to be intubated. She is a very poor candidate for CPR given co-morbids. She agrees to DNR status (grandson was at bedside for this discussion).   ASSESSMENT / PLAN:  Acute hypoxic respiratory failure in setting of severe left sided CAP and further c/b AECOPD. Appears to be slightly worse clinically based on notes describing her presentation BIPAP PRN-->no intubation Will need to regularly reassess status - may need to transition to comfort care if not rebounding. I suspect this will be the case given changes noted this am Scheduled BDs Flutter  Hold off on systemic steroids Empiric abx as below F/u blood cultures  Severe sepsis  Cont IVFs See above re: abx  H/o mod AS and HTN Holding antihypertensives from home for now  Lactic acidosis in setting of respiratory failure and sepsis. She is DNR and desires conservative  treatment but no invasive interventions. Improved with IVF and supportive care Hyperkalemia  AKI, Improved Cont IVFs  Hold antihypertensives Renal dose meds   H/o monoclonal gammopathy Leukocytosis in setting of CAP [place] heparin  Trend CBC  Mild hyperglycemia  ssi protocol   Protein calorie Malnutrition Clears for now given WOB, but will need dietary consult given wt loss  Macular Degeneration Cont home eye gtts   FAMILY  - Updates: grandson christopher updated.  No pressors, no central access. DNR/DNI.  Updated daughter 1/14 at bedside.   - Inter-disciplinary family meet or Palliative Care meeting due by:  Completed on admit   Independent CC time 62 min  Baltazar Apo, MD, PhD 02/07/2015, 9:45 AM [facility] [phone] or if no answer [phone]

## 2015-02-07 NOTE — Progress Notes (Signed)
[place] for cefepime and vancomycin Indication: sepsis  Allergies  Allergen Reactions  . Penicillins Anaphylaxis    Did PCN reaction causing immediate rash, facial/tongue/throat swelling, SOB or lightheadedness with hypotension:  Did PCN reaction causing severe rash involving mucus membranes or skin necrosis:  Did PCN reaction that required hospitalization  Did PCN reaction occurring within the last 10 years: no If all of the above answers are "NO", then may proceed with Cephalosporin use. Pt states it was about 50-60 years ago, i don't remember what it did. i just haven't taken it since  . Sulfa Antibiotics Anaphylaxis    Swelling and sob  . Bactrim [Sulfamethoxazole-Trimethoprim] Hives  . Pravastatin Other (See Comments)    Increases cpk    Patient Measurements: TBW 43 kg  Vital Signs: Temp: 98.3 F (36.8 C) (01/14 0811) Temp Source: Axillary (01/14 0414) BP: 102/50 mmHg (01/14 0700) Pulse Rate: 80 (01/14 0700) Intake/Output from previous day: 01/13 0701 - 01/14 0700 In: 2550 [P.O.:30; I.V.:1020; IV Piggyback:1500] Out: 24 [Urine:24] Intake/Output from this shift:    Labs:  Recent Labs  01/26/2015 1244 02/07/15 0525  WBC 29.3* 21.2*  HGB 13.2 12.9  PLT 395 371  CREATININE 1.55* 1.25*   CrCl cannot be calculated (Unknown ideal weight.). No results for input(s): VANCOTROUGH, VANCOPEAK, VANCORANDOM, GENTTROUGH, GENTPEAK, GENTRANDOM, TOBRATROUGH, TOBRAPEAK, TOBRARND, AMIKACINPEAK, AMIKACINTROU, AMIKACIN in the last 72 hours.   Microbiology: Recent Results (from the past 720 hour(s))  MRSA PCR Screening     Status: None   Collection Time: 02/21/2015  6:00 PM  Result Value Ref Range Status   MRSA by PCR NEGATIVE NEGATIVE Final    Comment:        The GeneXpert MRSA Assay (FDA approved for NASAL specimens only), is one component of a comprehensive MRSA colonization surveillance program. It is not intended to diagnose  MRSA infection nor to guide or monitor treatment for MRSA infections.     Medical History: Past Medical History  Diagnosis Date  . Hypertension   . COPD (chronic obstructive pulmonary disease) ([place])   . Macular degeneration   . History of pulmonary embolism     POST OP SURG 1948  . History of malignant neoplasm of ureter     S/P LEFT URETERECTOMY W/ REIMPLANTATION 2012 --  [facility]  . Diverticulosis of colon   . Hyperlipidemia   . Environmental allergies   . Asymptomatic stenosis of right carotid artery     MILD RIGHT ICA  40-50% PER DR [place] NOTE  . MGUS (monoclonal gammopathy of unknown significance)     HEMATOLOGIST-- DR Alen Blew  . Aortic stenosis, mild   . CKD (chronic kidney disease), stage III   . Bladder tumor   . GERD (gastroesophageal reflux disease)     NO MEDS    Assessment: 82 yo F presents on 1/13 with possible sepsis. Pharmacy consulted to dose cefepime. Give vancomycin and azithromycin x 1 in the ED. Afebrile, WBC elevated at 29.3>>21.2. SCr 1.25.  Pt received cefepime 2g and vancomycin 1g IV once in the ED.  Goal of Therapy:  Vancomycin trough level 15-20 mcg/ml   Plan:  Cefepime 1g IV q24h Vancomycin 750mg  IV q24h  Monitor clinical picture, renal function F/U C&S, abx deescalation / LOT  Andrey Cota. Diona Foley, PharmD, [place] Clinical Pharmacist Pager [phone]  02/07/2015 11:44 AM

## 2015-02-07 NOTE — Progress Notes (Signed)
Pt reluctant to wear Bipap.  Writer explained that Bipap can alleviate work of breathing.  Pt given prn xanax for anxiety and agrees to try Bipap again.  Pt tolerating well at this time, will continue to monitor.

## 2015-02-07 NOTE — Progress Notes (Signed)
Patient's breathing labored with tachypnea / coarse rhonchi.  Not able to expectorate secretions.  Affect flat, but appears to comprehend current status.  Daughter very anxious and teary.  Chaplain called and Dr. Lamonte Sakai notified of decline in status.  Emotional support provided to patient and family.

## 2015-02-07 NOTE — Progress Notes (Signed)
   02/07/15 1300  Clinical Encounter Type  Visited With Family;Patient and family together;Health care provider  Visit Type Initial;Critical Care;Spiritual support  Referral From Family;Nurse  Spiritual Encounters  Spiritual Needs Prayer;Emotional  Stress Factors  Family Stress Factors Family relationships;Health changes   Chaplain responded to the patient's daughter request to visit and talk. Chaplain conversed with the daughter, who seems to be concerned with some family and spiritual issues. Daughter requested that we pray with patient, and chaplain did so. Chaplain offered support, and our support is available as needed.   Jeri Lager, Chaplain 02/07/2015 1:02 PM

## 2015-02-07 NOTE — Progress Notes (Signed)
[place] [facility] Interval Note  Discussed status and prognosis with patient, daughter and grandsons at bedside. Explained that she likely will not recover, that BiPAP in this setting may not be effective due to poor secretion management. At this point I have recommended that she should ot go back on BiPAp, that we should focus on her comfort. They understand. I will order morphine, xanax, ativan for symptom relief, follow closely.   Baltazar Apo, MD, PhD 02/07/2015, 11:43 AM [facility] [phone] or if no answer [phone]

## 2015-02-07 NOTE — Progress Notes (Signed)
Initial Nutrition Assessment  DOCUMENTATION CODES:  Underweight  INTERVENTION:  Nutrition per pt/family wishes  NUTRITION DIAGNOSIS:  N/A patient now CC  GOAL:  Nutrition per pt/family wishes   MONITOR:  N/A comfort care  REASON FOR ASSESSMENT:  Consult Assessment of nutrition requirement/status  ASSESSMENT:  82 y/o female PMHx HTN, SEVERE COPD, PE, HLD, CKD 3, GERD. Pt presented to ED in acute distress after collapse. Pt was normal health until roughly Christmas and has since suffered multiple acute illnesses. Pt has continued to decline with progressive SOB, weakness and now has stopped eating/drinking 24 hr pta. Admitted/treated for acute hypoxic resp failure in setting of severe CAP/COPD exacerbation.   After note started and Shortly before  RD arrived, it was decided on [address]. RD asked family if the pt might want anything at all to drink/eat. Relatives were in disagreement about if they should give her something to eat/drink. The more vocal of the family members convinced the others stateing that anything she drank "would go straight to her lungs" and that "she cant have anything". Family member did not seem interest in conversing any more.   NFPE: not conducted  Diet Order:  Diet clear liquid Room service appropriate?: Yes; Fluid consistency:: Thin  Skin:  Pale, dry, flaky, skin tear  Last BM:  1/13-incontinent  Height:  Ht Readings from Last 1 Encounters:  08/14/12 4\' 10"  (1.473 m)   Weight:  Wt Readings from Last 1 Encounters:  02/07/15 91 lb 14.9 oz (41.7 kg)   Wt Readings from Last 10 Encounters:  02/07/15 91 lb 14.9 oz (41.7 kg)  08/21/12 94 lb (42.638 kg)  08/17/11 96 lb 6.4 oz (43.727 kg)  02/17/11 98 lb 9.6 oz (44.725 kg)  06/02/10 99 lb (44.906 kg)  Admitted at 88 lbs (40 kg)  Ideal Body Weight:  43.93 kg  BMI:  Body mass index using admit wt is 18.4 kg/(m^2).  Estimated Nutritional Needs:  Kcal:  1200-1400 (30-35 kcal/kg bw) Protein:   56-68 g (1.4-1.7 g/kg bw) Fluid:  1.2-1.4 liters fluid  EDUCATION NEEDS:  No education needs identified at this time  Burtis Junes RD, LDN Nutrition Pager: [phone] 02/07/2015 12:23 PM

## 2015-02-08 LAB — URINE CULTURE

## 2015-02-11 LAB — CULTURE, BLOOD (ROUTINE X 2)
Culture: NO GROWTH
Culture: NO GROWTH

## 2015-02-12 ENCOUNTER — Telehealth: Payer: Self-pay

## 2015-02-12 NOTE — Telephone Encounter (Signed)
On Feb 15, 2015 I received a death certificate from [facility] (Original). The death certificate is for cremation. The patient is a patient of Doctor Sood. The death certificate will be taken to Pulmonary Unit at [facility] this pm for signature. On 15-Feb-2015 I received the death certificate back from Doctor Tappan. I got the death certificate ready for pickup and called the funeral home to let them know the death certificate is ready for pickup.

## 2015-02-25 NOTE — Progress Notes (Signed)
Pt expired at 0212, asystole strip obtained, no heartbeat on auscultation confirmed with Alessandra Grout, BSN.  Dr. Tamala Julian contacted in [place] at [place].  Pt's daughter at bedside during pt's passing, and declines chaplain consult at this time.

## 2015-02-25 NOTE — Progress Notes (Signed)
Pt remains tachypneic, labored, and tachycardic on 100% NRB post prn morphine IVP.  Bipap not initiated per family's prior request and physician recommendation.   Pt's daughter and grandson at bedside.  Consulted with E-link nurse, will continue with current plan of care.

## 2015-02-25 DEATH — deceased

## 2015-03-25 NOTE — Discharge Summary (Signed)
PULMONARY / CRITICAL CARE MEDICINE   Name: [name] MRN: [id number] DOB: 1924-11-22    ADMISSION DATE:  03/01/15 CONSULTATION DATE:  02/28/2022 Date of death: 2015-03-03  Final cause of death: Left community acquired pneumonia  Secondary causes of death: Severe COPD with acute exacerbation Acute on chronic hypoxic respiratory failure Severe sepsis Acute metabolic acidosis Lactic acidosis Acute renal failure Hyperkalemia History of hypertension History of moderate aortic stenosis History of monoclonal gammopathy Protein calorie malnutrition  HISTORY OF PRESENT ILLNESS:   This is a 82 year old female w/ what appears to be severe COPD w/ evolving failure to thrive as evidenced by falls at home, decreased activity tolerance and wt loss. Presents to the ED on 28-Feb-2022 in acute distress. Per grandson who is her primary caregiver she had been in usual state of health until around Christmas when she was treated for an UTI. During that time she fell and sustained some chest trauma and was bedbound for a number of days. Then about a week prior to her presenting to the ED she went to her PCP w/ cough and dyspnea at which point she was diagnosed w/ CAP and treated w/ zpak and steroids. She continued to decline in-spite of this therapy and became progressively short of breath, weak and then stopped eating and drinking 2d prior to admit. Apparently she was ambulating to the BR w/ grandson prior to admit and collapsed. He called EMS, she was unresponsive on arrival and required CPAP in transport. Her initial ABG showed sig hypoxia w/ P/f ratio of 120. And CXR w/ significant left sided PNA. She was admitted to the hospital to the ICU and placed on noninvasive ventilation for initial support. She was found to have a lactic acidosis and acute renal insufficiency consistent with severe sepsis. She was treated with broad-spectrum antibiotics, schedule bronchodilators, and all medical care. Given her debilitated  state it was felt that she would be a poor candidate for intubation and for CPR. This was discussed with her son at bedside who agreed. Unfortunately despite BiPAP and medical care she did not show any signs of improvement. In fact her respiratory status worsened and showed some degree of risk for stress. At that point decision was made to shift her focus to comfort care and she was treated with morphine, Xanax, Ativan as needed for symptom control. She expired on 2015-03-03.    Baltazar Apo, MD, PhD 02/26/2015, 11:49 AM [facility] [phone] or if no answer [phone]

## 2016-02-28 IMAGING — DX DG RIBS W/ CHEST 3+V*L*
6 series · 6 of 6 positions shown · non-contrast
Comparison: 07/24/2014

CLINICAL DATA: Left lower rib pain, fall this morning on picnic
table

EXAM:
LEFT RIBS AND CHEST - 3+ VIEW

[chest ap (1 of 2)]
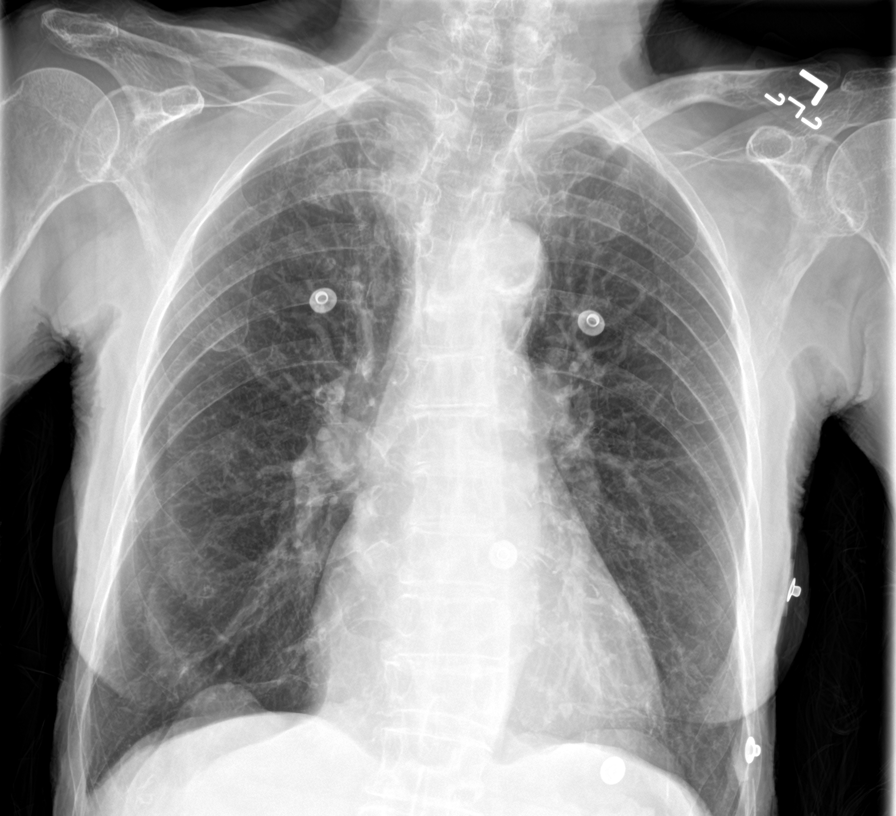

[rib ap (1 of 2)]
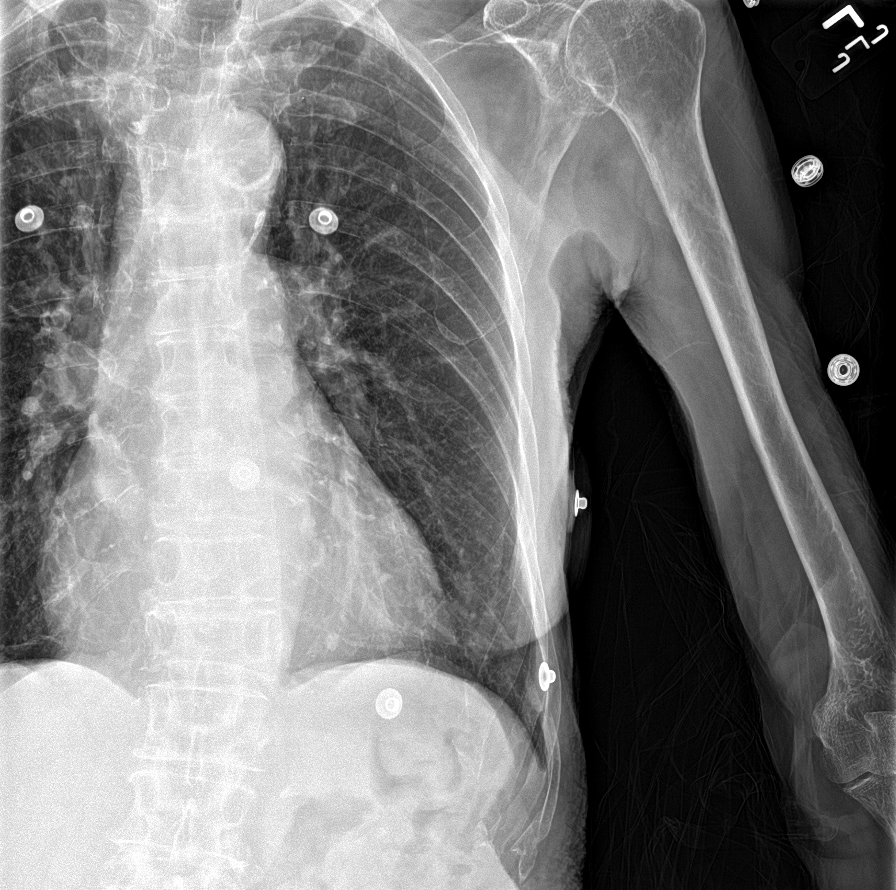

[rib ap obl (1 of 2)]
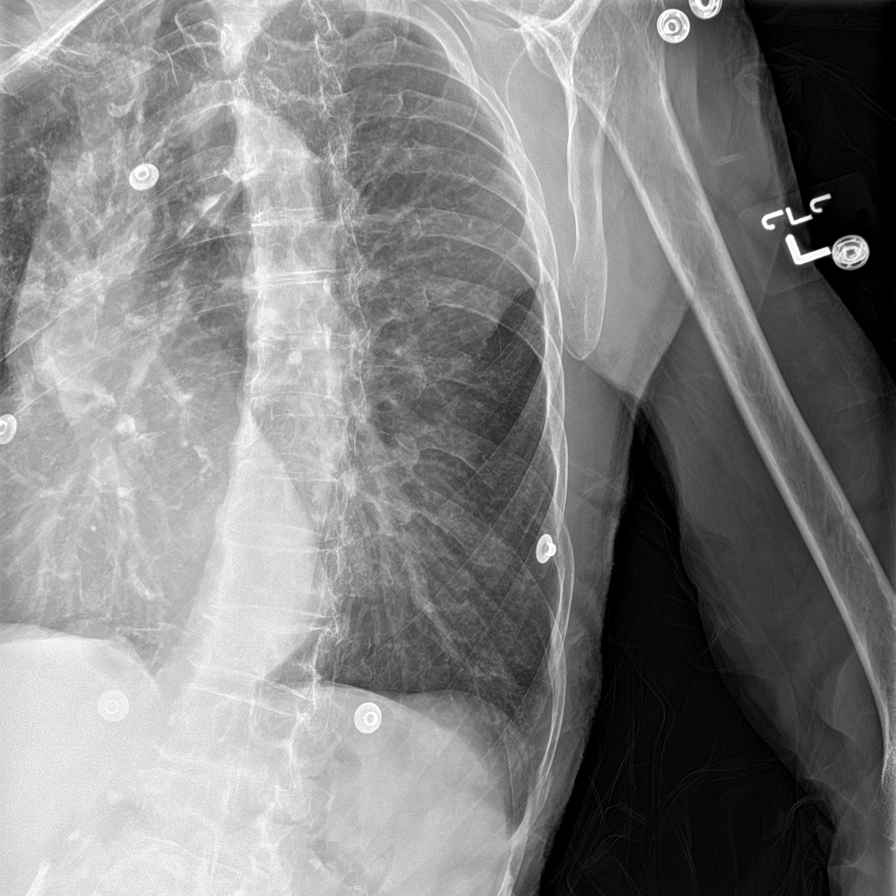

[rib ap (2 of 2)]
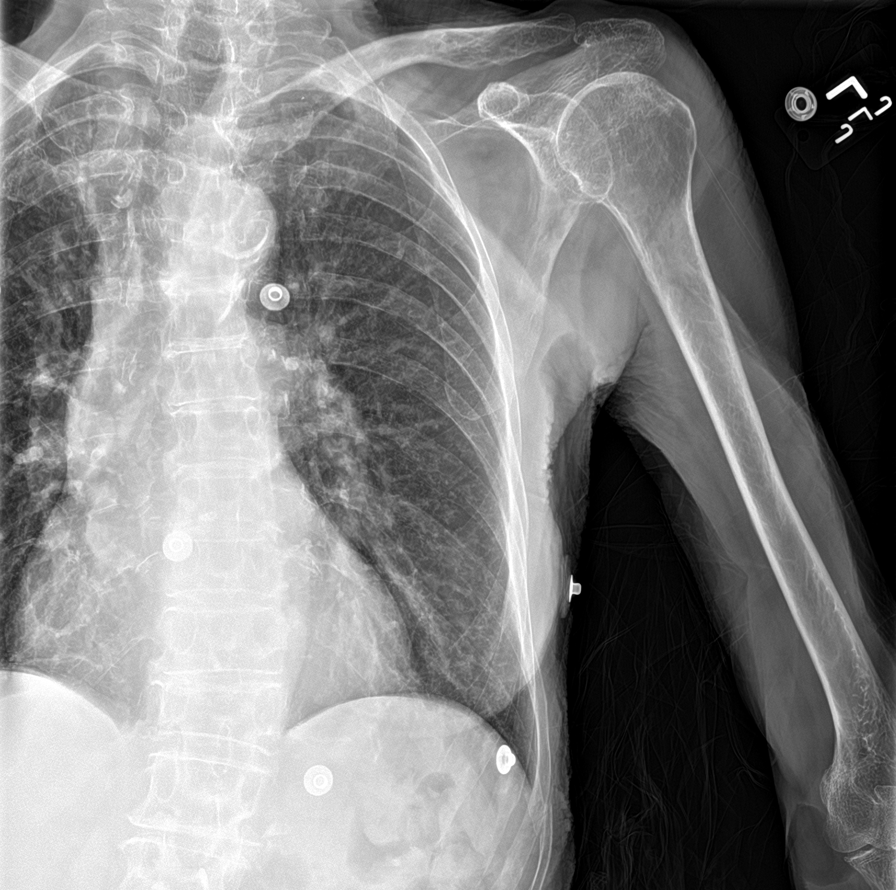

[rib ap obl (2 of 2)]
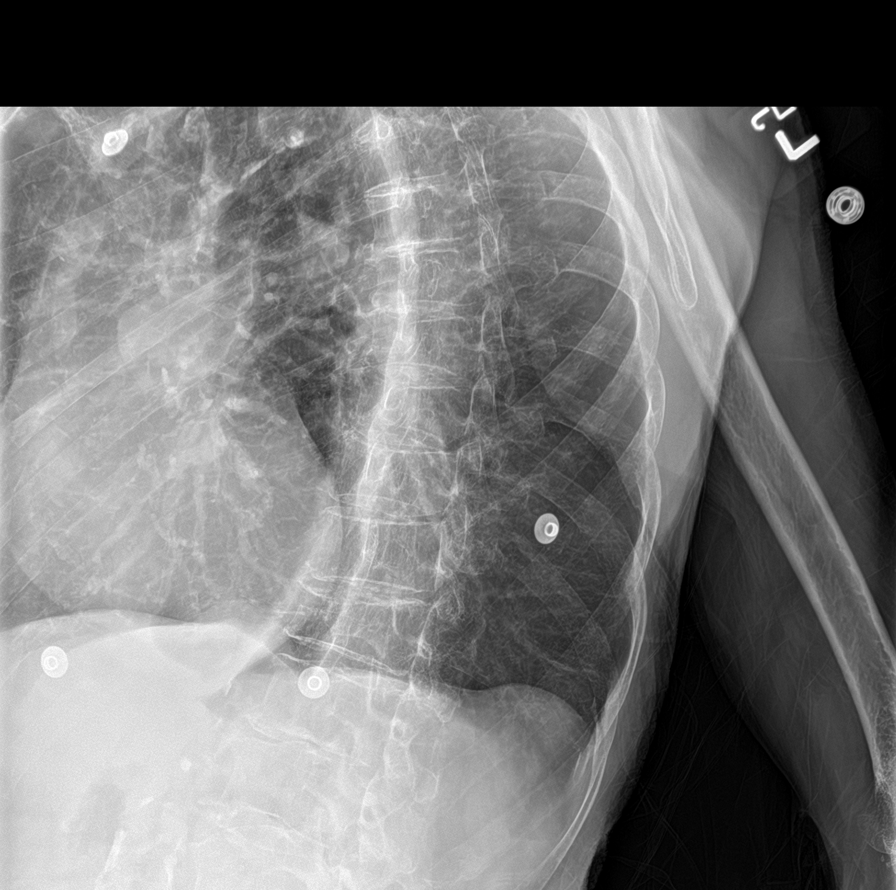

[chest ap (2 of 2)]
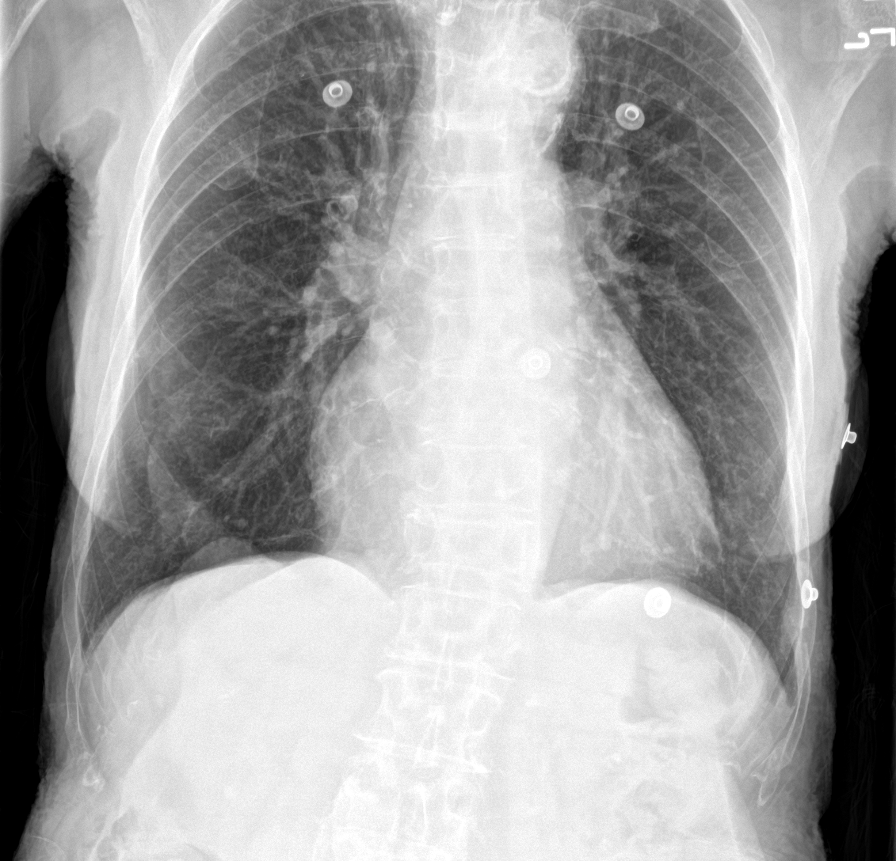

[6 of 6 positions shown; findings below may reference images not displayed]

FINDINGS: Hyperinflation again noted. No acute infiltrate or pulmonary edema.
There is no pneumothorax. There is minimal displaced fracture of the
left ninth rib. Question nondisplaced fracture of the left tenth
rib.
IMPRESSION: No pneumothorax. No acute infiltrate or pulmonary edema. Minimal
displaced fracture of the left ninth rib. Question nondisplaced
fracture of the left tenth rib.
